# Patient Record
Sex: Female | Born: 1950 | Race: White | Hispanic: No | Marital: Married | Smoking: Never smoker
Health system: Southern US, Community
[De-identification: ages and names within clinical notes are randomized; demographics above are authoritative.]

## PROBLEM LIST (undated history)

## (undated) DIAGNOSIS — K5090 Crohn's disease, unspecified, without complications: Secondary | ICD-10-CM

## (undated) DIAGNOSIS — M1990 Unspecified osteoarthritis, unspecified site: Secondary | ICD-10-CM

## (undated) DIAGNOSIS — I10 Essential (primary) hypertension: Secondary | ICD-10-CM

## (undated) DIAGNOSIS — I499 Cardiac arrhythmia, unspecified: Secondary | ICD-10-CM

## (undated) DIAGNOSIS — N6019 Diffuse cystic mastopathy of unspecified breast: Secondary | ICD-10-CM

## (undated) DIAGNOSIS — E119 Type 2 diabetes mellitus without complications: Secondary | ICD-10-CM

## (undated) HISTORY — PX: CHOLECYSTECTOMY: SHX55

## (undated) HISTORY — PX: COLONOSCOPY: SHX174

## (undated) HISTORY — DX: Type 2 diabetes mellitus without complications: E11.9

## (undated) HISTORY — DX: Crohn's disease, unspecified, without complications: K50.90

## (undated) HISTORY — DX: Diffuse cystic mastopathy of unspecified breast: N60.19

---

## 2005-05-10 ENCOUNTER — Ambulatory Visit: Payer: Self-pay | Admitting: Internal Medicine

## 2005-05-13 ENCOUNTER — Ambulatory Visit: Payer: Self-pay | Admitting: Internal Medicine

## 2005-06-12 ENCOUNTER — Ambulatory Visit: Payer: Self-pay | Admitting: Internal Medicine

## 2005-07-13 ENCOUNTER — Ambulatory Visit: Payer: Self-pay | Admitting: Internal Medicine

## 2006-01-25 ENCOUNTER — Ambulatory Visit: Payer: Self-pay

## 2006-02-02 ENCOUNTER — Ambulatory Visit: Payer: Self-pay

## 2006-07-25 ENCOUNTER — Ambulatory Visit: Payer: Self-pay | Admitting: General Surgery

## 2007-01-16 ENCOUNTER — Ambulatory Visit: Payer: Self-pay | Admitting: General Surgery

## 2007-12-23 ENCOUNTER — Emergency Department: Payer: Self-pay | Admitting: Emergency Medicine

## 2008-02-06 ENCOUNTER — Ambulatory Visit: Payer: Self-pay

## 2009-03-19 ENCOUNTER — Ambulatory Visit: Payer: Self-pay | Admitting: General Surgery

## 2010-03-20 ENCOUNTER — Ambulatory Visit: Payer: Self-pay | Admitting: General Surgery

## 2010-06-22 ENCOUNTER — Emergency Department: Payer: Self-pay | Admitting: Emergency Medicine

## 2010-07-07 ENCOUNTER — Ambulatory Visit: Payer: Self-pay | Admitting: Specialist

## 2011-05-12 ENCOUNTER — Ambulatory Visit: Payer: Self-pay

## 2012-06-05 DIAGNOSIS — K5080 Crohn's disease of both small and large intestine without complications: Secondary | ICD-10-CM | POA: Insufficient documentation

## 2012-06-13 ENCOUNTER — Ambulatory Visit: Payer: Self-pay | Admitting: General Surgery

## 2013-05-16 ENCOUNTER — Telehealth: Payer: Self-pay | Admitting: General Surgery

## 2013-05-16 NOTE — Telephone Encounter (Signed)
Message copied by Christene Lye on Wed May 16, 2013  3:07 PM ------      Message from: Lesly Rubenstein      Created: Tue May 08, 2013  3:20 PM      Regarding: recent mammogram       Patient was on July recall list for yearly mammogram and follow up office visit. Patient did not see you last year because her mammogram was negative. When I spoke with the patient she states that she already had her mammogram done at [facility] and that everything was negative and did not feel a need to return here to see you. I let her know that I would send you a note and get back with her if you felt you needed to see her this year. ------

## 2013-05-16 NOTE — Telephone Encounter (Signed)
Talked with pt regarding her decision to follow with mammogram and exam by Dr. Laurey Morale. She has 2 sisters with breast cancer and does not think they had genetic testing done. Explained role of genetic testing and rational use of MRI in high risk situations like her's. Pt states she will think about this and let us know.  Explained to pt that if she elects not to do genetic test or periodic MRI she should continue with monthly self exams, and yearly MD exam and mammogram without fail.

## 2013-12-13 HISTORY — PX: COLONOSCOPY: SHX174

## 2015-02-20 DIAGNOSIS — E781 Pure hyperglyceridemia: Secondary | ICD-10-CM | POA: Insufficient documentation

## 2016-02-16 DIAGNOSIS — E781 Pure hyperglyceridemia: Secondary | ICD-10-CM | POA: Diagnosis not present

## 2016-02-16 DIAGNOSIS — Z23 Encounter for immunization: Secondary | ICD-10-CM | POA: Diagnosis not present

## 2016-02-16 DIAGNOSIS — K5080 Crohn's disease of both small and large intestine without complications: Secondary | ICD-10-CM | POA: Diagnosis not present

## 2016-02-16 DIAGNOSIS — E119 Type 2 diabetes mellitus without complications: Secondary | ICD-10-CM | POA: Diagnosis not present

## 2016-02-23 DIAGNOSIS — Z0000 Encounter for general adult medical examination without abnormal findings: Secondary | ICD-10-CM | POA: Diagnosis not present

## 2016-02-23 DIAGNOSIS — E119 Type 2 diabetes mellitus without complications: Secondary | ICD-10-CM | POA: Diagnosis not present

## 2016-02-23 DIAGNOSIS — M79605 Pain in left leg: Secondary | ICD-10-CM | POA: Diagnosis not present

## 2016-02-23 DIAGNOSIS — M79604 Pain in right leg: Secondary | ICD-10-CM | POA: Diagnosis not present

## 2016-02-23 DIAGNOSIS — E669 Obesity, unspecified: Secondary | ICD-10-CM | POA: Diagnosis not present

## 2016-02-23 DIAGNOSIS — Z780 Asymptomatic menopausal state: Secondary | ICD-10-CM | POA: Diagnosis not present

## 2016-02-23 DIAGNOSIS — E781 Pure hyperglyceridemia: Secondary | ICD-10-CM | POA: Diagnosis not present

## 2016-02-23 DIAGNOSIS — K5080 Crohn's disease of both small and large intestine without complications: Secondary | ICD-10-CM | POA: Diagnosis not present

## 2016-03-05 DIAGNOSIS — Z780 Asymptomatic menopausal state: Secondary | ICD-10-CM | POA: Diagnosis not present

## 2016-03-09 DIAGNOSIS — Z01419 Encounter for gynecological examination (general) (routine) without abnormal findings: Secondary | ICD-10-CM | POA: Diagnosis not present

## 2016-03-09 DIAGNOSIS — R928 Other abnormal and inconclusive findings on diagnostic imaging of breast: Secondary | ICD-10-CM | POA: Diagnosis not present

## 2016-04-02 DIAGNOSIS — R922 Inconclusive mammogram: Secondary | ICD-10-CM | POA: Diagnosis not present

## 2016-04-02 DIAGNOSIS — R928 Other abnormal and inconclusive findings on diagnostic imaging of breast: Secondary | ICD-10-CM | POA: Diagnosis not present

## 2016-04-02 DIAGNOSIS — Z9889 Other specified postprocedural states: Secondary | ICD-10-CM | POA: Diagnosis not present

## 2016-04-02 DIAGNOSIS — R921 Mammographic calcification found on diagnostic imaging of breast: Secondary | ICD-10-CM | POA: Diagnosis not present

## 2016-04-12 ENCOUNTER — Encounter: Payer: Self-pay | Admitting: General Surgery

## 2016-04-12 ENCOUNTER — Ambulatory Visit (INDEPENDENT_AMBULATORY_CARE_PROVIDER_SITE_OTHER): Payer: PPO | Admitting: General Surgery

## 2016-04-12 VITALS — BP 124/76 | HR 74 | Resp 16 | Ht 65.0 in | Wt 176.0 lb

## 2016-04-12 DIAGNOSIS — R920 Mammographic microcalcification found on diagnostic imaging of breast: Secondary | ICD-10-CM

## 2016-04-12 NOTE — Patient Instructions (Signed)
Stereotactic Breast Biopsy A stereotactic breast biopsy is a procedure in which mammography is used in the collection of a sample of breast tissue. Mammography is a type of X-ray exam of the breasts that produces an image called a mammogram. The mammogram allows your health care provider to precisely locate the area of the breast from which a tissue sample will be taken. The tissue is then examined under a microscope to see if cancerous cells are present. A breast biopsy is done when:   A lump, abnormality, or mass is seen in the breast on a breast X-ray (mammogram).   Small calcium deposits (calcifications) are seen in the breast.   The shape or appearance of the breasts changes.   The shape or appearance of the nipples changes. You may have unusual or bloody discharge coming from the nipples, or you may have crusting, retraction, or dimpling of the nipples. A breast biopsy can indicate if you need surgery or other treatment.  LET [facility] CARE PROVIDER KNOW ABOUT:  Any allergies you have.  All medicines you are taking, including vitamins, herbs, eye drops, creams, and over-the-counter medicines.  Previous problems you or members of your family have had with the use of anesthetics.  Any blood disorders you have.  Previous surgeries you have had.  Medical conditions you have. RISKS AND COMPLICATIONS Generally, stereotactic breast biopsy is a safe procedure. However, as with any procedure, complications can occur. Possible complications include:  Infection at the needle-insertion site.   Bleeding or bruising after surgery.  The breast may become altered or deformed as a result of the procedure.  The needle may go through the chest wall into the lung area.  BEFORE THE PROCEDURE  Wear a supportive bra to the procedure.  You will be asked to remove jewelry, dentures, eyeglasses, metal objects, or clothing that might interfere with the X-ray images. You may want to leave  some of these objects at home.  Arrange for someone to drive you home after the procedure if desired. PROCEDURE  A stereotactic breast biopsy is done while you are awake. During the procedure, relax as much as possible. Let your health care provider know if you are uncomfortable, anxious, or in pain. Usually, the only discomfort felt during the procedure is caused by staying in one position for the length of the procedure. This discomfort can be reduced by carefully placed cushions. Most of the time the biopsy is done using a table with openings on it. You will be asked to lie facedown on the table and place your breasts through the openings. Your breast is compressed between metal plates to get good X-ray images. Your skin will be cleaned, and a numbing medicine (local anesthetic) will be injected. A small cut (incision) will be made in your breast. The tip of the biopsy needle will be directed through the incision. Several small pieces of suspicious tissue will be taken. Then, a final set of X-ray images will be obtained. If they show that the suspicious tissue has been mostly or completely removed, a small clip will be left at the biopsy site. This is done so that the biopsy site can be easily located if the results of the biopsy show that the tissue is cancerous.  After the procedure, the incision will be stitched (sutured) or taped and covered with a bandage (dressing). Your health care provider may apply a pressure dressing and an ice pack to prevent bleeding and swelling in the breast.  A stereotactic  breast biopsy can take 30 minutes or more. AFTER THE PROCEDURE  If you are doing well and have no problems, you will be allowed to go home.    This information is not intended to replace advice given to you by your health care provider. Make sure you discuss any questions you have with your health care provider.   Document Released: 08/28/2003 Document Revised: 12/04/2013 Document Reviewed:  06/28/2013 Elsevier Interactive Patient Education [employer].

## 2016-04-12 NOTE — Progress Notes (Signed)
Patient ID: Dorothy Murray, female   DOB: 12-27-1950, 65 y.o.   MRN: [id number]  Chief Complaint  Patient presents with  . Other    mammogram    HPI Dorothy Murray is a 65 y.o. female who presents for a breast evaluation. The most recent mammogram was done on 04/02/16. Patient does perform regular self breast checks and gets regular mammograms done. She denies any breast pain or tenderness.  I have reviewed the history of present illness with the patient.  HPI  Past Medical History  Diagnosis Date  . Crohn's disease ([place])   . Diffuse cystic mastopathy   . 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 Breast biopsy Left 2007    stereo  . Cholecystectomy    . Colonoscopy  2010, 2015    [facility]    Family History  Problem Relation Age of Onset  . Breast cancer Sister   . Breast cancer Daughter 80  . Heart attack Mother   . Heart attack Father   . Stomach cancer Brother   . Stomach cancer Sister     Social History Social History  Substance Use Topics  . Smoking status: Never Smoker   . Smokeless tobacco: Never Used  . Alcohol Use: No    No Known Allergies  Current Outpatient Prescriptions  Medication Sig Dispense Refill  . aspirin EC 81 MG tablet Take by mouth.    . metFORMIN (GLUCOPHAGE) 1000 MG tablet Take 1,000 mg by mouth 2 (two) times daily.  5  . Omega-3 Fatty Acids (FISH OIL PO) Take 1 capsule by mouth daily.    Marland Kitchen VICTOZA 18 MG/3ML SOPN INJECT 0.3 MLS (1.8 MG TOTAL) SUBCUTANEOUSLY ONCE DAILY.  5   No current facility-administered medications for this visit.    Review of Systems Review of Systems  Constitutional: Negative.   Respiratory: Negative.   Cardiovascular: Negative.     Blood pressure 124/76, pulse 74, resp. rate 16, height 5' 5"  (1.651 m), weight 176 lb (79.833 kg).  Physical Exam Physical Exam  Constitutional: She is oriented to person, place, and time. She appears  well-developed and well-nourished.  Eyes: Conjunctivae are normal. No scleral icterus.  Neck: Neck supple.  Cardiovascular: Normal rate, regular rhythm and normal heart sounds.   Pulmonary/Chest: Effort normal and breath sounds normal. Right breast exhibits no inverted nipple, no mass, no nipple discharge, no skin change and no tenderness. Left breast exhibits no inverted nipple, no mass, no nipple discharge, no skin change and no tenderness.  Abdominal: Soft. Bowel sounds are normal. There is no tenderness.  Lymphadenopathy:    She has no cervical adenopathy.    She has no axillary adenopathy.  Neurological: She is alert and oriented to person, place, and time.  Skin: Skin is warm and dry.    Data Reviewed Mammogram reviewed. Microcalcifications in right breast have increased in number. Also a new focus of calcifications in left breast-not as suspicious as right side.   Assessment   FH of breast cancer. Mammographic abnormality as above. Stereotactic biopsy of right breast and possibly left also. Pt is agreeable.    Plan    Stereotactic biopsy of both breasts. Follow up based on path finding    PCP:  [facility] This information has been scribed by Gaspar Cola CMA.    Atzin Buchta G 04/13/2016, 11:06 AM

## 2016-04-13 ENCOUNTER — Telehealth: Payer: Self-pay

## 2016-04-13 ENCOUNTER — Other Ambulatory Visit: Payer: Self-pay | Admitting: General Surgery

## 2016-04-13 ENCOUNTER — Encounter: Payer: Self-pay | Admitting: General Surgery

## 2016-04-13 ENCOUNTER — Other Ambulatory Visit: Payer: Self-pay

## 2016-04-13 DIAGNOSIS — R920 Mammographic microcalcification found on diagnostic imaging of breast: Secondary | ICD-10-CM

## 2016-04-13 NOTE — Telephone Encounter (Signed)
Patient is scheduled for a Stereo Biopsy of both breasts at [facility] on 04/19/16 at 1:00 pm. She is to arrive there by 12:30 pm. Paperwork and instructions have been mailed to her. She is aware of date, time, and instructions.

## 2016-04-15 ENCOUNTER — Telehealth: Payer: Self-pay | Admitting: *Deleted

## 2016-04-15 NOTE — Telephone Encounter (Signed)
-----   Message from Robert Bellow, MD sent at 04/14/2016  3:51 PM EDT ----- Patient is presently scheduled for bilateral stereotactic biopsy on Monday. This is Dr. Angie Fava patient. Please call her and make sure she has no questions or concerns about the procedure. We will plan to do the right breast first and do the left breast second.Marland Kitchen

## 2016-04-15 NOTE — Telephone Encounter (Signed)
I talked with the patient, questions answered with understanding. The patient is aware to call back for any further questions or concerns.

## 2016-04-19 ENCOUNTER — Ambulatory Visit
Admission: RE | Admit: 2016-04-19 | Discharge: 2016-04-19 | Disposition: A | Discharge status: Home / Self Care | Payer: PPO | Source: Ambulatory Visit | Attending: General Surgery | Admitting: General Surgery

## 2016-04-19 DIAGNOSIS — R920 Mammographic microcalcification found on diagnostic imaging of breast: Secondary | ICD-10-CM

## 2016-04-19 DIAGNOSIS — R921 Mammographic calcification found on diagnostic imaging of breast: Secondary | ICD-10-CM | POA: Insufficient documentation

## 2016-04-19 DIAGNOSIS — N6091 Unspecified benign mammary dysplasia of right breast: Secondary | ICD-10-CM | POA: Diagnosis not present

## 2016-04-19 DIAGNOSIS — D242 Benign neoplasm of left breast: Secondary | ICD-10-CM | POA: Diagnosis not present

## 2016-04-19 HISTORY — PX: BREAST BIOPSY: SHX20

## 2016-04-19 HISTORY — PX: BREAST EXCISIONAL BIOPSY: SUR124

## 2016-04-21 LAB — SURGICAL PATHOLOGY

## 2016-04-21 NOTE — Progress Notes (Signed)
Appointment with Dr. Jamal Collin for Monday, 04-26-16 at 4:15 pm.

## 2016-04-26 ENCOUNTER — Encounter: Payer: Self-pay | Admitting: General Surgery

## 2016-04-26 ENCOUNTER — Ambulatory Visit (INDEPENDENT_AMBULATORY_CARE_PROVIDER_SITE_OTHER): Payer: PPO | Admitting: General Surgery

## 2016-04-26 VITALS — BP 130/70 | HR 72 | Resp 14 | Ht 65.0 in | Wt 176.0 lb

## 2016-04-26 DIAGNOSIS — N6091 Unspecified benign mammary dysplasia of right breast: Secondary | ICD-10-CM | POA: Diagnosis not present

## 2016-04-26 NOTE — Progress Notes (Signed)
Patient ID: SARANNE CRISLIP, female   DOB: 02/03/1951, 65 y.o.   MRN: [id number]  Chief Complaint  Patient presents with  . Routine Post Op    stereo    HPI KRYSTIN KEEVEN is a 65 y.o. female. here today fopr her follow up bilateral stereo done on 04/19/16. Patient states she is doing well. I have reviewed the history of present illness with the patient.  HPI  Past Medical History  Diagnosis Date  . Crohn's disease ([place])   . Diffuse cystic mastopathy   . 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 Cholecystectomy    . Colonoscopy  2010, 2015    [facility]  . Breast biopsy Left 2007    stereo  . Breast biopsy Bilateral 04/19/2016    stereo w/ Dr. Bary Castilla path pending    Family History  Problem Relation Age of Onset  . Breast cancer Sister   . Breast cancer Daughter 67  . Heart attack Mother   . Heart attack Father   . Stomach cancer Brother   . Stomach cancer Sister     Social History Social History  Substance Use Topics  . Smoking status: Never Smoker   . Smokeless tobacco: Never Used  . Alcohol Use: No    No Known Allergies  Current Outpatient Prescriptions  Medication Sig Dispense Refill  . aspirin EC 81 MG tablet Take by mouth.    . metFORMIN (GLUCOPHAGE) 1000 MG tablet Take 1,000 mg by mouth 2 (two) times daily.  5  . Omega-3 Fatty Acids (FISH OIL PO) Take 1 capsule by mouth daily.    Marland Kitchen VICTOZA 18 MG/3ML SOPN INJECT 0.3 MLS (1.8 MG TOTAL) SUBCUTANEOUSLY ONCE DAILY.  5   No current facility-administered medications for this visit.    Review of Systems Review of Systems  Blood pressure 130/70, pulse 72, resp. rate 14, height 5' 5"  (1.651 m), weight 176 lb (79.833 kg).  Physical Exam Physical Exam  Constitutional: She is oriented to person, place, and time. She appears well-developed and well-nourished.  Eyes: Conjunctivae are normal. No scleral icterus.  Neck: Neck supple.  Cardiovascular:  Normal rate, regular rhythm and normal heart sounds.   Pulmonary/Chest: Effort normal and breath sounds normal.  Lymphadenopathy:    She has no cervical adenopathy.  Neurological: She is alert and oriented to person, place, and time.  Skin: Skin is warm and dry.    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 Wide excision right breast lesion indicated. Discussed fully with pt and she is agreeable.    Plan    Patient to have a right breast lumpectomy.  Procedure explained to her including risks and benefits This patient's surgery has been scheduled for 05-03-16 at [facility].     PCP:  Tracie Harrier This information has been scribed by Gaspar Cola CMA.    Nevia Henkin G 04/26/2016, 5:00 PM

## 2016-04-26 NOTE — Addendum Note (Signed)
Addended by: Dominga Ferry on: 04/26/2016 05:13 PM   Modules accepted: Orders

## 2016-04-26 NOTE — Patient Instructions (Addendum)
Patient to have a right breast lumpectomy done at [facility].  This patient's surgery has been scheduled for 05-03-16 at [facility].

## 2016-04-27 ENCOUNTER — Telehealth: Payer: Self-pay

## 2016-04-27 ENCOUNTER — Other Ambulatory Visit: Payer: Self-pay | Admitting: General Surgery

## 2016-04-27 DIAGNOSIS — N6091 Unspecified benign mammary dysplasia of right breast: Secondary | ICD-10-CM

## 2016-04-27 NOTE — Telephone Encounter (Signed)
Spoke with patient about her surgery at [facility] scheduled for 05/03/16. She is to arrive at the [facility] on 05/03/16 at 8:00 am. Patient is aware of date, time, and instructions.

## 2016-04-29 ENCOUNTER — Encounter: Payer: Self-pay | Admitting: *Deleted

## 2016-04-29 ENCOUNTER — Other Ambulatory Visit: Payer: PPO

## 2016-04-29 NOTE — Patient Instructions (Addendum)
  Your procedure is scheduled on: 05-03-16 [facility]) Report to [place] @ 8AM   Remember: Instructions that are not followed completely may result in serious medical risk, up to and including death, or upon the discretion of your surgeon and anesthesiologist your surgery may need to be rescheduled.    _X___ 1. Do not eat food or drink liquids after midnight. No gum chewing or hard candies.     _X___ 2. No Alcohol for 24 hours before or after surgery.   ____ 3. Bring all medications with you on the day of surgery if instructed.    _X___ 4. Notify your doctor if there is any change in your medical condition     (cold, fever, infections).     Do not wear jewelry, make-up, hairpins, clips or nail polish.  Do not wear lotions, powders, or perfumes. You may wear deodorant.  Do not shave 48 hours prior to surgery. Men may shave face and neck.  Do not bring valuables to the hospital.    [facility] is not responsible for any belongings or valuables.               Contacts, dentures or bridgework may not be worn into surgery.  Leave your suitcase in the car. After surgery it may be brought to your room.  For patients admitted to the hospital, discharge time is determined by your treatment team.   Patients discharged the day of surgery will not be allowed to drive home.   Please read over the following fact sheets that you were given:     ____ Take these medicines the morning of surgery with A SIP OF WATER:    1. NONE  2.   3.   4.  5.  6.  ____ Fleet Enema (as directed)   _X___ Use CHG Soap as directed  ____ Use inhalers on the day of surgery  _X__ Stop metformin 2 days prior to surgery-LAST DOSE ON Friday 04-30-16  _X__ Take 1/2 of usual insulin dose the night before surgery and none on the morning of surgery-ONLY TAKE 10 UNITS OF LANTUS ON Sunday NIGHT AND NO INSULIN AM OF SURGERY  ____ Stop Coumadin/Plavix/aspirin-OK TO CONTINUE 81 MG ASPIRIN-DO NOT TAKE THE  MORNING OF SURGERY  _X__ Stop Anti-inflammatories-NO NSAIDS OR ASPIRIN PRODUCTS-TYLENOL OK TO TAKE   _X__ Stop supplements until after surgery-STOP FISH OIL NOW, GARLIC AND CINNAMON NOW  ____ Bring C-Pap to the hospital.

## 2016-04-30 ENCOUNTER — Inpatient Hospital Stay
Admission: RE | Admit: 2016-04-30 | Discharge: 2016-04-30 | Disposition: A | Discharge status: Home / Self Care | Payer: PPO | Source: Ambulatory Visit

## 2016-04-30 ENCOUNTER — Ambulatory Visit
Admission: RE | Admit: 2016-04-30 | Discharge: 2016-04-30 | Disposition: A | Discharge status: Home / Self Care | Payer: PPO | Source: Ambulatory Visit | Attending: General Surgery | Admitting: General Surgery

## 2016-04-30 DIAGNOSIS — R002 Palpitations: Secondary | ICD-10-CM | POA: Diagnosis not present

## 2016-04-30 DIAGNOSIS — Z01810 Encounter for preprocedural cardiovascular examination: Secondary | ICD-10-CM | POA: Insufficient documentation

## 2016-04-30 DIAGNOSIS — E119 Type 2 diabetes mellitus without complications: Secondary | ICD-10-CM | POA: Insufficient documentation

## 2016-05-03 ENCOUNTER — Ambulatory Visit
Admission: RE | Admit: 2016-05-03 | Discharge: 2016-05-03 | Disposition: A | Discharge status: Home / Self Care | Payer: PPO | Source: Ambulatory Visit | Attending: General Surgery | Admitting: General Surgery

## 2016-05-03 ENCOUNTER — Encounter: Payer: Self-pay | Admitting: *Deleted

## 2016-05-03 ENCOUNTER — Encounter
Admission: RE | Disposition: A | Discharge status: Home / Self Care | Payer: Self-pay | Source: Ambulatory Visit | Attending: General Surgery

## 2016-05-03 ENCOUNTER — Ambulatory Visit: Payer: PPO | Admitting: Anesthesiology

## 2016-05-03 DIAGNOSIS — N6081 Other benign mammary dysplasias of right breast: Secondary | ICD-10-CM | POA: Diagnosis not present

## 2016-05-03 DIAGNOSIS — Z803 Family history of malignant neoplasm of breast: Secondary | ICD-10-CM | POA: Diagnosis not present

## 2016-05-03 DIAGNOSIS — Z7982 Long term (current) use of aspirin: Secondary | ICD-10-CM | POA: Insufficient documentation

## 2016-05-03 DIAGNOSIS — Z800 Family history of malignant neoplasm of digestive organs: Secondary | ICD-10-CM | POA: Diagnosis not present

## 2016-05-03 DIAGNOSIS — N6091 Unspecified benign mammary dysplasia of right breast: Secondary | ICD-10-CM

## 2016-05-03 DIAGNOSIS — Z8249 Family history of ischemic heart disease and other diseases of the circulatory system: Secondary | ICD-10-CM | POA: Diagnosis not present

## 2016-05-03 DIAGNOSIS — K660 Peritoneal adhesions (postprocedural) (postinfection): Secondary | ICD-10-CM | POA: Diagnosis not present

## 2016-05-03 DIAGNOSIS — E119 Type 2 diabetes mellitus without complications: Secondary | ICD-10-CM | POA: Diagnosis not present

## 2016-05-03 DIAGNOSIS — Z79899 Other long term (current) drug therapy: Secondary | ICD-10-CM | POA: Insufficient documentation

## 2016-05-03 DIAGNOSIS — R002 Palpitations: Secondary | ICD-10-CM | POA: Diagnosis not present

## 2016-05-03 DIAGNOSIS — Z7984 Long term (current) use of oral hypoglycemic drugs: Secondary | ICD-10-CM | POA: Diagnosis not present

## 2016-05-03 DIAGNOSIS — N6489 Other specified disorders of breast: Secondary | ICD-10-CM | POA: Diagnosis not present

## 2016-05-03 DIAGNOSIS — K5090 Crohn's disease, unspecified, without complications: Secondary | ICD-10-CM | POA: Diagnosis not present

## 2016-05-03 DIAGNOSIS — Z9049 Acquired absence of other specified parts of digestive tract: Secondary | ICD-10-CM | POA: Diagnosis not present

## 2016-05-03 HISTORY — PX: BREAST LUMPECTOMY: SHX2

## 2016-05-03 HISTORY — DX: Cardiac arrhythmia, unspecified: I49.9

## 2016-05-03 HISTORY — PX: BREAST LUMPECTOMY WITH NEEDLE LOCALIZATION: SHX5759

## 2016-05-03 LAB — GLUCOSE, CAPILLARY
GLUCOSE-CAPILLARY: 231 mg/dL — AB (ref 65–99)
GLUCOSE-CAPILLARY: 163 mg/dL — AB (ref 65–99)

## 2016-05-03 SURGERY — BREAST LUMPECTOMY
Anesthesia: General | Laterality: Right | Wound class: Clean

## 2016-05-03 MED ORDER — METHYLENE BLUE 0.5 % INJ SOLN
INTRAVENOUS | Status: AC
  Filled 2016-05-03: qty 10

## 2016-05-03 MED ORDER — SODIUM CHLORIDE 0.9 % IJ SOLN
INTRAMUSCULAR | Status: AC
  Filled 2016-05-03: qty 10

## 2016-05-03 MED ORDER — FENTANYL CITRATE (PF) 100 MCG/2ML IJ SOLN
INTRAMUSCULAR | Status: DC | PRN
  Administered 2016-05-03 (×2): 50 ug via INTRAVENOUS

## 2016-05-03 MED ORDER — FAMOTIDINE 20 MG PO TABS
ORAL_TABLET | ORAL | Status: AC
  Filled 2016-05-03: qty 1

## 2016-05-03 MED ORDER — CHLORHEXIDINE GLUCONATE 4 % EX LIQD: 1.0000 "application " | Freq: Once | CUTANEOUS | Status: DC

## 2016-05-03 MED ORDER — TRAMADOL HCL 50 MG PO TABS
50.0000 mg | ORAL_TABLET | Freq: Four times a day (QID) | ORAL | Status: DC
  Administered 2016-05-03: 50 mg via ORAL
  Filled 2016-05-03: qty 1

## 2016-05-03 MED ORDER — ACETAMINOPHEN 10 MG/ML IV SOLN
INTRAVENOUS | Status: DC | PRN
  Administered 2016-05-03: 1000 mg via INTRAVENOUS

## 2016-05-03 MED ORDER — TRAMADOL HCL 50 MG PO TABS: 50.0000 mg | ORAL_TABLET | Freq: Four times a day (QID) | ORAL | Status: DC | PRN

## 2016-05-03 MED ORDER — FENTANYL CITRATE (PF) 100 MCG/2ML IJ SOLN: 25.0000 ug | INTRAMUSCULAR | Status: DC | PRN

## 2016-05-03 MED ORDER — ACETAMINOPHEN 10 MG/ML IV SOLN
INTRAVENOUS | Status: AC
  Filled 2016-05-03: qty 100

## 2016-05-03 MED ORDER — TRAMADOL HCL 50 MG PO TABS
ORAL_TABLET | ORAL | Status: AC
  Administered 2016-05-03: 50 mg via ORAL
  Filled 2016-05-03: qty 1

## 2016-05-03 MED ORDER — BUPIVACAINE HCL (PF) 0.5 % IJ SOLN
INTRAMUSCULAR | Status: AC
  Filled 2016-05-03: qty 30

## 2016-05-03 MED ORDER — ONDANSETRON HCL 4 MG/2ML IJ SOLN: 4.0000 mg | Freq: Once | INTRAMUSCULAR | Status: DC | PRN

## 2016-05-03 MED ORDER — PHENYLEPHRINE HCL 10 MG/ML IJ SOLN
INTRAMUSCULAR | Status: DC | PRN
  Administered 2016-05-03 (×3): 100 ug via INTRAVENOUS

## 2016-05-03 MED ORDER — PROPOFOL 10 MG/ML IV BOLUS
INTRAVENOUS | Status: DC | PRN
  Administered 2016-05-03: 150 mg via INTRAVENOUS

## 2016-05-03 MED ORDER — LIDOCAINE HCL (CARDIAC) 20 MG/ML IV SOLN
INTRAVENOUS | Status: DC | PRN
  Administered 2016-05-03: 60 mg via INTRAVENOUS

## 2016-05-03 MED ORDER — MIDAZOLAM HCL 2 MG/2ML IJ SOLN
INTRAMUSCULAR | Status: DC | PRN
  Administered 2016-05-03: 2 mg via INTRAVENOUS

## 2016-05-03 MED ORDER — SODIUM CHLORIDE 0.9 % IV SOLN
INTRAVENOUS | Status: DC
  Administered 2016-05-03 (×2): via INTRAVENOUS

## 2016-05-03 MED ORDER — CEFAZOLIN SODIUM 1-5 GM-% IV SOLN
INTRAVENOUS | Status: DC | PRN
  Administered 2016-05-03: 1 g via INTRAVENOUS

## 2016-05-03 MED ORDER — FAMOTIDINE 20 MG PO TABS
20.0000 mg | ORAL_TABLET | Freq: Once | ORAL | Status: AC
  Administered 2016-05-03: 20 mg via ORAL

## 2016-05-03 SURGICAL SUPPLY — 43 items
BLADE SURG 15 STRL SS SAFETY (BLADE) ×4 IMPLANT
BULB RESERV EVAC DRAIN JP 100C (MISCELLANEOUS) IMPLANT
CANISTER SUCT 1200ML W/VALVE (MISCELLANEOUS) ×2 IMPLANT
CHLORAPREP W/TINT 26ML (MISCELLANEOUS) ×2 IMPLANT
CNTNR SPEC 2.5X3XGRAD LEK (MISCELLANEOUS) ×2
CONT SPEC 4OZ STER OR WHT (MISCELLANEOUS) ×2
CONTAINER SPEC 2.5X3XGRAD LEK (MISCELLANEOUS) ×2 IMPLANT
COVER PROBE FLX POLY STRL (MISCELLANEOUS) ×2 IMPLANT
DEVICE DUBIN SPECIMEN MAMMOGRA (MISCELLANEOUS) ×2 IMPLANT
DEVICE LOCALIZATION ULTRAWIRE (WIRE) IMPLANT
DEVICE ULTRAWIRE LOCAL 19X9 (WIRE) IMPLANT
DRAIN CHANNEL JP 15F RND 16 (MISCELLANEOUS) IMPLANT
DRAPE LAPAROTOMY 100X77 ABD (DRAPES) ×2 IMPLANT
DRAPE LAPAROTOMY TRNSV 106X77 (MISCELLANEOUS) ×2 IMPLANT
ELECT REM PT RETURN 9FT ADLT (ELECTROSURGICAL) ×2
ELECTRODE REM PT RTRN 9FT ADLT (ELECTROSURGICAL) ×1 IMPLANT
GLOVE BIO SURGEON STRL SZ7 (GLOVE) ×2 IMPLANT
GOWN STRL REUS W/ TWL LRG LVL3 (GOWN DISPOSABLE) ×3 IMPLANT
GOWN STRL REUS W/TWL LRG LVL3 (GOWN DISPOSABLE) ×3
HARMONIC SCALPEL FOCUS (MISCELLANEOUS) IMPLANT
KIT RM TURNOVER STRD PROC AR (KITS) ×2 IMPLANT
LABEL OR SOLS (LABEL) ×2 IMPLANT
LIQUID BAND (GAUZE/BANDAGES/DRESSINGS) ×2 IMPLANT
MARGIN MAP 10MM (MISCELLANEOUS) ×2 IMPLANT
NDL HPO THNWL 1X22GA REG BVL (NEEDLE) ×1 IMPLANT
NEEDLE HYPO 25X1 1.5 SAFETY (NEEDLE) ×2 IMPLANT
NEEDLE SAFETY 22GX1 (NEEDLE) ×1
PACK BASIN MINOR ARMC (MISCELLANEOUS) ×2 IMPLANT
SUT ETH BLK MONO 3 0 FS 1 12/B (SUTURE) ×2 IMPLANT
SUT ETHILON 3-0 FS-10 30 BLK (SUTURE) ×2
SUT MNCRL AB 3-0 PS2 27 (SUTURE) ×2 IMPLANT
SUT VIC AB 2-0 BRD 54 (SUTURE) ×2 IMPLANT
SUT VIC AB 2-0 CT2 27 (SUTURE) ×4 IMPLANT
SUT VIC AB 2-0 SH 27 (SUTURE) ×1
SUT VIC AB 2-0 SH 27XBRD (SUTURE) ×1 IMPLANT
SUT VIC AB 3-0 54X BRD REEL (SUTURE) ×1 IMPLANT
SUT VIC AB 3-0 BRD 54 (SUTURE) ×1
SUT VIC AB 4-0 FS2 27 (SUTURE) ×2 IMPLANT
SUTURE EHLN 3-0 FS-10 30 BLK (SUTURE) ×1 IMPLANT
SYR CONTROL 10ML (SYRINGE) ×2 IMPLANT
ULTRAWIRE LOCAL DEVICE 19X9 (WIRE)
ULTRAWIRE LOCALIZATION DEVICE (WIRE)
WATER STERILE IRR 1000ML POUR (IV SOLUTION) ×2 IMPLANT

## 2016-05-03 NOTE — Anesthesia Preprocedure Evaluation (Signed)
Anesthesia Evaluation  Patient identified by MRN, date of birth, ID band Patient awake    Reviewed: Allergy & Precautions, NPO status , Patient's Chart, lab work & pertinent test results  Airway Mallampati: II       Dental  (+) Teeth Intact   Pulmonary neg pulmonary ROS,    Pulmonary exam normal        Cardiovascular negative cardio ROS   Rhythm:Regular Rate:Normal     Neuro/Psych    GI/Hepatic negative GI ROS, Neg liver ROS,   Endo/Other  diabetes, Well Controlled, Type 1, Insulin Dependent  Renal/GU negative Renal ROS     Musculoskeletal negative musculoskeletal ROS (+)   Abdominal Normal abdominal exam  (+)   Peds  Hematology negative hematology ROS (+)   Anesthesia Other Findings   Reproductive/Obstetrics                             Anesthesia Physical Anesthesia Plan  ASA: II  Anesthesia Plan: General   Post-op Pain Management:    Induction: Intravenous  Airway Management Planned: LMA  Additional Equipment:   Intra-op Plan:   Post-operative Plan: Extubation in OR  Informed Consent: I have reviewed the patients History and Physical, chart, labs and discussed the procedure including the risks, benefits and alternatives for the proposed anesthesia with the patient or authorized representative who has indicated his/her understanding and acceptance.     Plan Discussed with: CRNA  Anesthesia Plan Comments:         Anesthesia Quick Evaluation

## 2016-05-03 NOTE — Anesthesia Postprocedure Evaluation (Signed)
Anesthesia Post Note  Patient: Dorothy Murray  Procedure(s) Performed: Procedure(s) (LRB): BREAST LUMPECTOMY (Right) BREAST LUMPECTOMY WITH NEEDLE LOCALIZATION (Right)  Patient location during evaluation: PACU Anesthesia Type: General Level of consciousness: awake Pain management: pain level controlled Vital Signs Assessment: post-procedure vital signs reviewed and stable Respiratory status: nonlabored ventilation Cardiovascular status: stable Anesthetic complications: no    Last Vitals:  Filed Vitals:   05/03/16 1208 05/03/16 1226  BP: 142/66 146/72  Pulse: 64 67  Temp:    Resp: 14 15    Last Pain:  Filed Vitals:   05/03/16 1229  PainSc: 1                  VAN STAVEREN,Doris Mcgilvery

## 2016-05-03 NOTE — Interval H&P Note (Signed)
History and Physical Interval Note:  05/03/2016 9:29 AM  Dorothy Murray  has presented today for surgery, with the diagnosis of ADH RIGHT BREAST  The various methods of treatment have been discussed with the patient and family. After consideration of risks, benefits and other options for treatment, the patient has consented to  Procedure(s): BREAST LUMPECTOMY (Right) BREAST LUMPECTOMY WITH NEEDLE LOCALIZATION (Right) as a surgical intervention .  The patient's history has been reviewed, patient examined, no change in status, stable for surgery.  I have reviewed the patient's chart and labs.  Questions were answered to the patient's satisfaction.     Mavryk Pino G

## 2016-05-03 NOTE — Discharge Instructions (Signed)
AMBULATORY SURGERY  °DISCHARGE INSTRUCTIONS ° ° °1) The drugs that you were given will stay in your system until tomorrow so for the next 24 hours you should not: ° °A) Drive an automobile °B) Make any legal decisions °C) Drink any alcoholic beverage ° ° °2) You may resume regular meals tomorrow.  Today it is better to start with liquids and gradually work up to solid foods. ° °You may eat anything you prefer, but it is better to start with liquids, then soup and crackers, and gradually work up to solid foods. ° ° °3) Please notify your doctor immediately if you have any unusual bleeding, trouble breathing, redness and pain at the surgery site, drainage, fever, or pain not relieved by medication. ° ° ° °4) Additional Instructions: ° ° ° ° ° ° ° °Please contact your physician with any problems or Same Day Surgery at [phone], Monday through Friday 6 am to 4 pm, or [place] [facility] Main number at [phone]. °

## 2016-05-03 NOTE — H&P (View-Only) (Signed)
Patient ID: Dorothy Murray, female   DOB: 01-30-1951, 65 y.o.   MRN: [id number]  Chief Complaint  Patient presents with  . Routine Post Op    stereo    HPI Dorothy Murray is a 65 y.o. female. here today fopr her follow up bilateral stereo done on 04/19/16. Patient states she is doing well. I have reviewed the history of present illness with the patient.  HPI  Past Medical History  Diagnosis Date  . Crohn's disease ([place])   . Diffuse cystic mastopathy   . 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 Cholecystectomy    . Colonoscopy  2010, 2015    [facility]  . Breast biopsy Left 2007    stereo  . Breast biopsy Bilateral 04/19/2016    stereo w/ Dr. Bary Castilla path pending    Family History  Problem Relation Age of Onset  . Breast cancer Sister   . Breast cancer Daughter 43  . Heart attack Mother   . Heart attack Father   . Stomach cancer Brother   . Stomach cancer Sister     Social History Social History  Substance Use Topics  . Smoking status: Never Smoker   . Smokeless tobacco: Never Used  . Alcohol Use: No    No Known Allergies  Current Outpatient Prescriptions  Medication Sig Dispense Refill  . aspirin EC 81 MG tablet Take by mouth.    . metFORMIN (GLUCOPHAGE) 1000 MG tablet Take 1,000 mg by mouth 2 (two) times daily.  5  . Omega-3 Fatty Acids (FISH OIL PO) Take 1 capsule by mouth daily.    Marland Kitchen VICTOZA 18 MG/3ML SOPN INJECT 0.3 MLS (1.8 MG TOTAL) SUBCUTANEOUSLY ONCE DAILY.  5   No current facility-administered medications for this visit.    Review of Systems Review of Systems  Blood pressure 130/70, pulse 72, resp. rate 14, height 5' 5"  (1.651 m), weight 176 lb (79.833 kg).  Physical Exam Physical Exam  Constitutional: She is oriented to person, place, and time. She appears well-developed and well-nourished.  Eyes: Conjunctivae are normal. No scleral icterus.  Neck: Neck supple.  Cardiovascular:  Normal rate, regular rhythm and normal heart sounds.   Pulmonary/Chest: Effort normal and breath sounds normal.  Lymphadenopathy:    She has no cervical adenopathy.  Neurological: She is alert and oriented to person, place, and time.  Skin: Skin is warm and dry.    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 Wide excision right breast lesion indicated. Discussed fully with pt and she is agreeable.    Plan    Patient to have a right breast lumpectomy.  Procedure explained to her including risks and benefits This patient's surgery has been scheduled for 05-03-16 at [facility].     PCP:  Tracie Harrier This information has been scribed by Gaspar Cola CMA.    Tyonna Talerico G 04/26/2016, 5:00 PM

## 2016-05-03 NOTE — Op Note (Signed)
Preop diagnosis: ADH right breast  Post op diagnosis: Same  Operation: Right breast wide excision  Surgeon: Harvin Hazel  Assistant:  Anesthesia: Gen.  Complications: None  EBL: Minimal  Drains: None  Description: This patient on recent stereo biopsy of an area of microcalcifications in right breast was noted to have ADH. Patient underwent a wire localization of the clip placed at this site in the upper outer quadrant of the right breast preoperatively. Patient was brought to the operating room and put to sleep with an LMA. Right breast was prepped and draped as sterile field. Timeout was performed. The area of concern in the upper-outer quadrant was relatively in the anterior depth of the breast and a curvilinear incision just inferior to the wire entrance was planned. Incision was made and deepened through the subcutaneous tissue and then the skin and subcutaneous tissue were elevated on both sides. The wire was freed from the overlying skin and with that as a guide to complete the excision of the area of concern was performed. Bleeding was minimal and controlled easily with cautery. The excised tissue was tagged for margins and specimen mammogram was obtained showing presence of the previously placed clip in the center of it. The specimen subsequently was sent to pathology. Wound was irrigated and closed the deeper tissues being closed with interrupted 2-0 Vicryl. Skin was closed with subcuticular 3-0 Monocryl and liqui  ban applied. Patient subsequently was extubated and returned recovery room stable condition

## 2016-05-03 NOTE — Interval H&P Note (Signed)
History and Physical Interval Note:  05/03/2016 9:30 AM  Dorothy Murray  has presented today for surgery, with the diagnosis of ADH RIGHT BREAST  The various methods of treatment have been discussed with the patient and family. After consideration of risks, benefits and other options for treatment, the patient has consented to  Procedure(s): BREAST LUMPECTOMY (Right) BREAST LUMPECTOMY WITH NEEDLE LOCALIZATION (Right) as a surgical intervention .  The patient's history has been reviewed, patient examined, no change in status, stable for surgery.  I have reviewed the patient's chart and labs.  Questions were answered to the patient's satisfaction.     SANKAR,SEEPLAPUTHUR G

## 2016-05-03 NOTE — Anesthesia Procedure Notes (Signed)
Procedure Name: LMA Insertion Date/Time: 05/03/2016 9:59 AM Performed by: [facility] Pre-anesthesia Checklist: Patient identified, Emergency Drugs available, Suction available and Patient being monitored Patient Re-evaluated:Patient Re-evaluated prior to inductionOxygen Delivery Method: Circle system utilized Preoxygenation: Pre-oxygenation with 100% oxygen Intubation Type: IV induction Ventilation: Mask ventilation without difficulty LMA: LMA inserted LMA Size: 3.5 Number of attempts: 1 Placement Confirmation: positive ETCO2 and breath sounds checked- equal and bilateral

## 2016-05-03 NOTE — Transfer of Care (Signed)
Immediate Anesthesia Transfer of Care Note  Patient: Dorothy Murray  Procedure(s) Performed: Procedure(s): BREAST LUMPECTOMY (Right) BREAST LUMPECTOMY WITH NEEDLE LOCALIZATION (Right)  Patient Location: PACU  Anesthesia Type:General  Level of Consciousness: sedated  Airway & Oxygen Therapy: Patient Spontanous Breathing and Patient connected to nasal cannula oxygen  Post-op Assessment: Report given to RN and Post -op Vital signs reviewed and stable  Post vital signs: Reviewed and stable  Last Vitals:  Filed Vitals:   05/03/16 0849 05/03/16 1053  BP: 146/72 145/78  Pulse: 81 75  Temp: 36.9 C 36.2 C  Resp: 16 13    Last Pain:  Filed Vitals:   05/03/16 1054  PainSc: Asleep         Complications: No apparent anesthesia complications

## 2016-05-03 NOTE — H&P (View-Only) (Signed)
Patient ID: Dorothy Murray, female   DOB: Feb 05, 1951, 65 y.o.   MRN: [id number]  Chief Complaint  Patient presents with  . Other    mammogram    HPI Dorothy Murray is a 65 y.o. female who presents for a breast evaluation. The most recent mammogram was done on 04/02/16. Patient does perform regular self breast checks and gets regular mammograms done. She denies any breast pain or tenderness.  I have reviewed the history of present illness with the patient.  HPI  Past Medical History  Diagnosis Date  . Crohn's disease ([place])   . Diffuse cystic mastopathy   . 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 Breast biopsy Left 2007    stereo  . Cholecystectomy    . Colonoscopy  2010, 2015    [facility]    Family History  Problem Relation Age of Onset  . Breast cancer Sister   . Breast cancer Daughter 53  . Heart attack Mother   . Heart attack Father   . Stomach cancer Brother   . Stomach cancer Sister     Social History Social History  Substance Use Topics  . Smoking status: Never Smoker   . Smokeless tobacco: Never Used  . Alcohol Use: No    No Known Allergies  Current Outpatient Prescriptions  Medication Sig Dispense Refill  . aspirin EC 81 MG tablet Take by mouth.    . metFORMIN (GLUCOPHAGE) 1000 MG tablet Take 1,000 mg by mouth 2 (two) times daily.  5  . Omega-3 Fatty Acids (FISH OIL PO) Take 1 capsule by mouth daily.    Marland Kitchen VICTOZA 18 MG/3ML SOPN INJECT 0.3 MLS (1.8 MG TOTAL) SUBCUTANEOUSLY ONCE DAILY.  5   No current facility-administered medications for this visit.    Review of Systems Review of Systems  Constitutional: Negative.   Respiratory: Negative.   Cardiovascular: Negative.     Blood pressure 124/76, pulse 74, resp. rate 16, height 5' 5"  (1.651 m), weight 176 lb (79.833 kg).  Physical Exam Physical Exam  Constitutional: She is oriented to person, place, and time. She appears  well-developed and well-nourished.  Eyes: Conjunctivae are normal. No scleral icterus.  Neck: Neck supple.  Cardiovascular: Normal rate, regular rhythm and normal heart sounds.   Pulmonary/Chest: Effort normal and breath sounds normal. Right breast exhibits no inverted nipple, no mass, no nipple discharge, no skin change and no tenderness. Left breast exhibits no inverted nipple, no mass, no nipple discharge, no skin change and no tenderness.  Abdominal: Soft. Bowel sounds are normal. There is no tenderness.  Lymphadenopathy:    She has no cervical adenopathy.    She has no axillary adenopathy.  Neurological: She is alert and oriented to person, place, and time.  Skin: Skin is warm and dry.    Data Reviewed Mammogram reviewed. Microcalcifications in right breast have increased in number. Also a new focus of calcifications in left breast-not as suspicious as right side.   Assessment   FH of breast cancer. Mammographic abnormality as above. Stereotactic biopsy of right breast and possibly left also. Pt is agreeable.    Plan    Stereotactic biopsy of both breasts. Follow up based on path finding    PCP:  [facility] This information has been scribed by Gaspar Cola CMA.    Adriell Polansky G 04/13/2016, 11:06 AM

## 2016-05-05 LAB — SURGICAL PATHOLOGY

## 2016-05-06 ENCOUNTER — Ambulatory Visit (INDEPENDENT_AMBULATORY_CARE_PROVIDER_SITE_OTHER): Payer: PPO | Admitting: General Surgery

## 2016-05-06 ENCOUNTER — Encounter: Payer: Self-pay | Admitting: General Surgery

## 2016-05-06 VITALS — BP 134/80 | HR 82 | Resp 14 | Ht 65.5 in | Wt 175.0 lb

## 2016-05-06 DIAGNOSIS — N6091 Unspecified benign mammary dysplasia of right breast: Secondary | ICD-10-CM

## 2016-05-06 DIAGNOSIS — N62 Hypertrophy of breast: Secondary | ICD-10-CM

## 2016-05-06 NOTE — Patient Instructions (Addendum)
The patient is aware to call back for any questions or concerns. Tamoxifen oral tablet What is this medicine? TAMOXIFEN (ta MOX i fen) blocks the effects of estrogen. It is commonly used to treat breast cancer. It is also used to decrease the chance of breast cancer coming back in women who have received treatment for the disease. It may also help prevent breast cancer in women who have a high risk of developing breast cancer. This medicine may be used for other purposes; ask your health care provider or pharmacist if you have questions. What should I tell my health care provider before I take this medicine? They need to know if you have any of these conditions: -blood clots -blood disease -cataracts or impaired eyesight -endometriosis -high calcium levels -high cholesterol -irregular menstrual cycles -liver disease -stroke -uterine fibroids -an unusual or allergic reaction to tamoxifen, other medicines, foods, dyes, or preservatives -pregnant or trying to get pregnant -breast-feeding How should I use this medicine? Take this medicine by mouth with a glass of water. Follow the directions on the prescription label. You can take it with or without food. Take your medicine at regular intervals. Do not take your medicine more often than directed. Do not stop taking except on your doctor's advice. A special MedGuide will be given to you by the pharmacist with each prescription and refill. Be sure to read this information carefully each time. Talk to your pediatrician regarding the use of this medicine in children. While this drug may be prescribed for selected conditions, precautions do apply. Overdosage: If you think you have taken too much of this medicine contact a poison control center or emergency room at once. NOTE: This medicine is only for you. Do not share this medicine with others. What if I miss a dose? If you miss a dose, take it as soon as you can. If it is almost time for your  next dose, take only that dose. Do not take double or extra doses. What may interact with this medicine? -aminoglutethimide -bromocriptine -chemotherapy drugs -female hormones, like estrogens and birth control pills -letrozole -medroxyprogesterone -phenobarbital -rifampin -warfarin This list may not describe all possible interactions. Give your health care provider a list of all the medicines, herbs, non-prescription drugs, or dietary supplements you use. Also tell them if you smoke, drink alcohol, or use illegal drugs. Some items may interact with your medicine. What should I watch for while using this medicine? Visit your doctor or health care professional for regular checks on your progress. You will need regular pelvic exams, breast exams, and mammograms. If you are taking this medicine to reduce your risk of getting breast cancer, you should know that this medicine does not prevent all types of breast cancer. If breast cancer or other problems occur, there is no guarantee that it will be found at an early stage. Do not become pregnant while taking this medicine or for 2 months after stopping this medicine. Stop taking this medicine if you get pregnant or think you are pregnant and contact your doctor. This medicine may harm your unborn baby. Women who can possibly become pregnant should use birth control methods that do not use hormones during tamoxifen treatment and for 2 months after therapy has stopped. Talk with your health care provider for birth control advice. Do not breast feed while taking this medicine. What side effects may I notice from receiving this medicine? Side effects that you should report to your doctor or health care professional as soon  as possible: -changes in vision (blurred vision) -changes in your menstrual cycle -difficulty breathing or shortness of breath -difficulty walking or talking -new breast lumps -numbness -pelvic pain or pressure -redness, blistering,  peeling or loosening of the skin, including inside the mouth -skin rash or itching (hives) -sudden chest pain -swelling of lips, face, or tongue -swelling, pain or tenderness in your calf or leg -unusual bruising or bleeding -vaginal discharge that is bloody, brown, or rust -weakness -yellowing of the whites of the eyes or skin Side effects that usually do not require medical attention (report to your doctor or health care professional if they continue or are bothersome): -fatigue -hair loss, although uncommon and is usually mild -headache -hot flashes -impotence (in men) -nausea, vomiting (mild) -vaginal discharge (white or clear) This list may not describe all possible side effects. Call your doctor for medical advice about side effects. You may report side effects to FDA at 1-800-FDA-1088. Where should I keep my medicine? Keep out of the reach of children. Store at room temperature between 20 and 25 degrees C (68 and 77 degrees F). Protect from light. Keep container tightly closed. Throw away any unused medicine after the expiration date. NOTE: This sheet is a summary. It may not cover all possible information. If you have questions about this medicine, talk to your doctor, pharmacist, or health care provider.    2016, Elsevier/Gold Standard. (2008-08-15 12:01:56)

## 2016-05-06 NOTE — Progress Notes (Signed)
Here today for postoperative visit, right breast lumpectomy on 05-03-16 for ADH, she states she is doing well. Minimal to no pain. I have reviewed the history of present illness with the patient.  Incision healing well, no redness or drainage. Path- ADH, margins clear. Pt advised  Discussed antihormonal therapy in detail, information given.  Follow up in 2 weeks.  PCP:  Tracie Harrier This information has been scribed by Karie Fetch RN, BSN,BC.

## 2016-05-09 ENCOUNTER — Encounter: Payer: Self-pay | Admitting: General Surgery

## 2016-05-12 ENCOUNTER — Encounter: Payer: Self-pay | Admitting: General Surgery

## 2016-05-24 ENCOUNTER — Encounter: Payer: Self-pay | Admitting: *Deleted

## 2016-05-31 ENCOUNTER — Encounter: Payer: Self-pay | Admitting: General Surgery

## 2016-05-31 ENCOUNTER — Ambulatory Visit (INDEPENDENT_AMBULATORY_CARE_PROVIDER_SITE_OTHER): Payer: PPO | Admitting: General Surgery

## 2016-05-31 VITALS — BP 130/76 | HR 66 | Resp 12 | Ht 65.0 in | Wt 174.0 lb

## 2016-05-31 DIAGNOSIS — Z803 Family history of malignant neoplasm of breast: Secondary | ICD-10-CM

## 2016-05-31 DIAGNOSIS — N62 Hypertrophy of breast: Secondary | ICD-10-CM

## 2016-05-31 DIAGNOSIS — N6091 Unspecified benign mammary dysplasia of right breast: Secondary | ICD-10-CM

## 2016-05-31 NOTE — Progress Notes (Signed)
Here today for postoperative visit, right breast lumpectomy on 05-03-16 for ADH. she states she is doing well. Minimal to no pain.  I have reviewed the history of present illness with the patient.    Incision on RUQ of right breast is healing well, no erythema or drainage. Patient considered but declined Anti-hormonal therapy for prevention.  Path- ADH, margins clear. Pt advised Return in November with Right Diagnostic Mammogram and office visit. Pt to continue self-breast exams. Pt is agreeable to plan.   This has been scribed by Lesly Rubenstein LPN  PCP:  Tracie Harrier

## 2016-06-14 DIAGNOSIS — M79605 Pain in left leg: Secondary | ICD-10-CM | POA: Diagnosis not present

## 2016-06-14 DIAGNOSIS — E781 Pure hyperglyceridemia: Secondary | ICD-10-CM | POA: Diagnosis not present

## 2016-06-14 DIAGNOSIS — E669 Obesity, unspecified: Secondary | ICD-10-CM | POA: Diagnosis not present

## 2016-06-14 DIAGNOSIS — E119 Type 2 diabetes mellitus without complications: Secondary | ICD-10-CM | POA: Diagnosis not present

## 2016-06-14 DIAGNOSIS — M79604 Pain in right leg: Secondary | ICD-10-CM | POA: Diagnosis not present

## 2016-06-14 DIAGNOSIS — Z0000 Encounter for general adult medical examination without abnormal findings: Secondary | ICD-10-CM | POA: Diagnosis not present

## 2016-06-14 DIAGNOSIS — K5080 Crohn's disease of both small and large intestine without complications: Secondary | ICD-10-CM | POA: Diagnosis not present

## 2016-06-24 DIAGNOSIS — M79604 Pain in right leg: Secondary | ICD-10-CM | POA: Diagnosis not present

## 2016-06-24 DIAGNOSIS — Z794 Long term (current) use of insulin: Secondary | ICD-10-CM | POA: Diagnosis not present

## 2016-06-24 DIAGNOSIS — G8929 Other chronic pain: Secondary | ICD-10-CM | POA: Diagnosis not present

## 2016-06-24 DIAGNOSIS — E1140 Type 2 diabetes mellitus with diabetic neuropathy, unspecified: Secondary | ICD-10-CM | POA: Diagnosis not present

## 2016-06-24 DIAGNOSIS — K5080 Crohn's disease of both small and large intestine without complications: Secondary | ICD-10-CM | POA: Diagnosis not present

## 2016-06-24 DIAGNOSIS — M79605 Pain in left leg: Secondary | ICD-10-CM | POA: Diagnosis not present

## 2016-09-17 DIAGNOSIS — M79605 Pain in left leg: Secondary | ICD-10-CM | POA: Diagnosis not present

## 2016-09-17 DIAGNOSIS — Z794 Long term (current) use of insulin: Secondary | ICD-10-CM | POA: Diagnosis not present

## 2016-09-17 DIAGNOSIS — E1140 Type 2 diabetes mellitus with diabetic neuropathy, unspecified: Secondary | ICD-10-CM | POA: Diagnosis not present

## 2016-09-17 DIAGNOSIS — K5080 Crohn's disease of both small and large intestine without complications: Secondary | ICD-10-CM | POA: Diagnosis not present

## 2016-09-17 DIAGNOSIS — M79604 Pain in right leg: Secondary | ICD-10-CM | POA: Diagnosis not present

## 2016-09-17 DIAGNOSIS — G8929 Other chronic pain: Secondary | ICD-10-CM | POA: Diagnosis not present

## 2016-09-21 ENCOUNTER — Other Ambulatory Visit: Payer: Self-pay | Admitting: General Surgery

## 2016-09-21 DIAGNOSIS — N6091 Unspecified benign mammary dysplasia of right breast: Secondary | ICD-10-CM

## 2016-10-01 DIAGNOSIS — E669 Obesity, unspecified: Secondary | ICD-10-CM | POA: Diagnosis not present

## 2016-10-01 DIAGNOSIS — E1169 Type 2 diabetes mellitus with other specified complication: Secondary | ICD-10-CM | POA: Diagnosis not present

## 2016-10-01 DIAGNOSIS — K5080 Crohn's disease of both small and large intestine without complications: Secondary | ICD-10-CM | POA: Diagnosis not present

## 2016-10-01 DIAGNOSIS — Z23 Encounter for immunization: Secondary | ICD-10-CM | POA: Diagnosis not present

## 2016-10-05 ENCOUNTER — Encounter: Payer: Self-pay | Admitting: *Deleted

## 2016-11-01 ENCOUNTER — Ambulatory Visit: Payer: PPO

## 2016-11-01 ENCOUNTER — Other Ambulatory Visit: Payer: PPO

## 2016-11-03 ENCOUNTER — Ambulatory Visit: Payer: PPO | Admitting: General Surgery

## 2016-11-09 ENCOUNTER — Ambulatory Visit: Payer: PPO | Admitting: General Surgery

## 2016-11-22 ENCOUNTER — Ambulatory Visit: Payer: PPO | Admitting: General Surgery

## 2016-11-22 ENCOUNTER — Ambulatory Visit
Admission: RE | Admit: 2016-11-22 | Discharge: 2016-11-22 | Disposition: A | Discharge status: Home / Self Care | Payer: PPO | Source: Ambulatory Visit | Attending: General Surgery | Admitting: General Surgery

## 2016-11-22 ENCOUNTER — Other Ambulatory Visit: Payer: Self-pay | Admitting: General Surgery

## 2016-11-22 DIAGNOSIS — R928 Other abnormal and inconclusive findings on diagnostic imaging of breast: Secondary | ICD-10-CM | POA: Diagnosis not present

## 2016-11-22 DIAGNOSIS — N6091 Unspecified benign mammary dysplasia of right breast: Secondary | ICD-10-CM

## 2016-11-25 ENCOUNTER — Encounter: Payer: Self-pay | Admitting: *Deleted

## 2016-11-30 ENCOUNTER — Encounter: Payer: Self-pay | Admitting: General Surgery

## 2016-11-30 ENCOUNTER — Ambulatory Visit (INDEPENDENT_AMBULATORY_CARE_PROVIDER_SITE_OTHER): Payer: PPO | Admitting: General Surgery

## 2016-11-30 VITALS — BP 130/70 | HR 72 | Resp 12 | Ht 65.0 in | Wt 181.0 lb

## 2016-11-30 DIAGNOSIS — N6091 Unspecified benign mammary dysplasia of right breast: Secondary | ICD-10-CM | POA: Diagnosis not present

## 2016-11-30 NOTE — Progress Notes (Signed)
Patient ID: Dorothy Murray, female   DOB: 1951/12/09, 65 y.o.   MRN: [id number]  Chief Complaint  Patient presents with  . Follow-up    HPI Dorothy Murray is a 65 y.o. female who presents for a breast ADH follow up .The most recent mammogram was done on 11/01/2016.   Patient does perform regular self breast checks and gets regular mammograms done. Patient declined Anti-hormonal therapy for prevention.Patient states she is having some troubles with pain in both legs,R>L, with walking, when she sits or lays down no pain. The pain is worst at the end of the day.  I have reviewed the history of present illness with the patient.  HPI  Past Medical History:  Diagnosis Date  . Crohn's disease ([place])   . 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 Diffuse cystic mastopathy   . Dysrhythmia     Past Surgical History:  Procedure Laterality Date  . BREAST BIOPSY Left 2007   stereo  . BREAST BIOPSY Bilateral 04/19/2016   stereo w/ Dr. Bary Castilla path pending  . BREAST EXCISIONAL BIOPSY Right 05/03/2016   exc bc for atypical ductal hyperplasia  . BREAST LUMPECTOMY Right 05/03/2016   ATYPICAL DUCTAL HYPERPLASIA (ADH) WITH MICROCALCIFICATIONS/: BREAST LUMPECTOMY; Ellwood Sayers, MD;  Location: [facility] ORS;  Service: General;  Laterality: Right;  . BREAST LUMPECTOMY WITH NEEDLE LOCALIZATION Right 05/03/2016   Procedure: BREAST LUMPECTOMY WITH NEEDLE LOCALIZATION;  Surgeon: Christene Lye, MD;  Location: [facility] ORS;  Service: General;  Laterality: Right;  . CHOLECYSTECTOMY    . COLONOSCOPY  2010, 2015   [facility]    Family History  Problem Relation Age of Onset  . Breast cancer Sister   . Breast cancer Daughter 42  . Heart attack Mother   . Heart attack Father   . Stomach cancer Brother   . Stomach cancer Sister     Social History Social History  Substance Use Topics  . Smoking status: Never Smoker  . Smokeless tobacco: Never Used  . Alcohol use No    No Known Allergies  Current  Outpatient Prescriptions  Medication Sig Dispense Refill  . aspirin EC 81 MG tablet Take by mouth.    . calcium carbonate (OS-CAL) 1250 (500 Ca) MG chewable tablet Chew 1 tablet by mouth daily.    Marland Kitchen CINNAMON PO Take 1 tablet by mouth daily.    . ergocalciferol (VITAMIN D2) 50000 units capsule Take 50,000 Units by mouth once a week.    Marland Kitchen GARLIC PO Take 1 tablet by mouth daily.    . insulin glargine (LANTUS) 100 UNIT/ML injection Inject 20 Units into the skin at bedtime.    . metFORMIN (GLUCOPHAGE) 1000 MG tablet Take 1,000 mg by mouth 2 (two) times daily.  5  . Omega-3 Fatty Acids (FISH OIL PO) Take 1 capsule by mouth 2 (two) times daily.     Marland Kitchen VICTOZA 18 MG/3ML SOPN INJECT 0.3 MLS (1.8 MG TOTAL) SUBCUTANEOUSLY ONCE DAILY.  5   No current facility-administered medications for this visit.     Review of Systems Review of Systems  Constitutional: Negative.   Respiratory: Negative.   Cardiovascular: Negative.     Blood pressure 130/70, pulse 72, resp. rate 12, height 5' 5"  (1.651 m), weight 181 lb (82.1 kg).  Physical Exam Physical Exam  Constitutional: She is oriented to person, place, and time. She appears well-developed and well-nourished.  Eyes: Conjunctivae are normal. No scleral icterus.  Neck: Neck supple.  Cardiovascular:  Normal rate, regular rhythm and normal heart sounds.   Pulses:      Dorsalis pedis pulses are 2+ on the right side, and 2+ on the left side.       Posterior tibial pulses are 2+ on the right side, and 2+ on the left side.  Feet are warm and pink with brisk capillary refill. Few VV seen back of both calves. No leg edema or skin changes  Pulmonary/Chest: Effort normal and breath sounds normal. Right breast exhibits no inverted nipple, no mass, no nipple discharge, no skin change and no tenderness. Left breast exhibits no inverted nipple, no mass, no nipple discharge, no skin change and no tenderness.  Abdominal: Soft. Bowel sounds are normal. There is no  tenderness.  Lymphadenopathy:    She has no cervical adenopathy.    She has no axillary adenopathy.  Neurological: She is alert and oriented to person, place, and time.  Skin: Skin is warm and dry.    Data Reviewed Right breast mammogram reviewed -stable  Assessment    ADH right breast, post lumpectomy. VV mild.     Plan   Rx given- calf length stockings with ankle pr of 4mHG. Patient to use compassion hose daily.  Return in one month for leg check and ultrasound.  Patient to return in six month with bilateral screening mammogram.      This information has been scribed by JGaspar ColaCMA.    Dorothy Murray 11/30/2016, 2:29 PM

## 2016-11-30 NOTE — Patient Instructions (Signed)
Patient to use compassion hose daily. Return in one month for leg check and ultrasound. Patient to return in six month for bilateral screening mammogram.

## 2017-01-04 ENCOUNTER — Ambulatory Visit: Payer: PPO | Admitting: General Surgery

## 2017-01-24 DIAGNOSIS — Z23 Encounter for immunization: Secondary | ICD-10-CM | POA: Diagnosis not present

## 2017-01-24 DIAGNOSIS — E1169 Type 2 diabetes mellitus with other specified complication: Secondary | ICD-10-CM | POA: Diagnosis not present

## 2017-01-24 DIAGNOSIS — K5080 Crohn's disease of both small and large intestine without complications: Secondary | ICD-10-CM | POA: Diagnosis not present

## 2017-01-24 DIAGNOSIS — E669 Obesity, unspecified: Secondary | ICD-10-CM | POA: Diagnosis not present

## 2017-01-31 DIAGNOSIS — K5080 Crohn's disease of both small and large intestine without complications: Secondary | ICD-10-CM | POA: Diagnosis not present

## 2017-01-31 DIAGNOSIS — E669 Obesity, unspecified: Secondary | ICD-10-CM | POA: Diagnosis not present

## 2017-01-31 DIAGNOSIS — E1169 Type 2 diabetes mellitus with other specified complication: Secondary | ICD-10-CM | POA: Diagnosis not present

## 2017-01-31 DIAGNOSIS — E781 Pure hyperglyceridemia: Secondary | ICD-10-CM | POA: Diagnosis not present

## 2017-01-31 DIAGNOSIS — M5441 Lumbago with sciatica, right side: Secondary | ICD-10-CM | POA: Diagnosis not present

## 2017-01-31 DIAGNOSIS — Z0000 Encounter for general adult medical examination without abnormal findings: Secondary | ICD-10-CM | POA: Diagnosis not present

## 2017-01-31 DIAGNOSIS — G8929 Other chronic pain: Secondary | ICD-10-CM | POA: Diagnosis not present

## 2017-03-24 ENCOUNTER — Other Ambulatory Visit: Payer: Self-pay

## 2017-03-24 DIAGNOSIS — Z1231 Encounter for screening mammogram for malignant neoplasm of breast: Secondary | ICD-10-CM

## 2017-04-04 IMAGING — MG MM PLC BREAST LOC DEV 1ST LESION INC*R*
6 series · 6 of 6 positions shown · non-contrast
Comparison: Previous exams.

CLINICAL DATA: 64-year-old female for wire localization of biopsy
site demonstrating atypical ductal hyperplasia

EXAM:
NEEDLE LOCALIZATION OF THE RIGHT BREAST WITH MAMMO GUIDANCE

[R CC (1 of 2)]
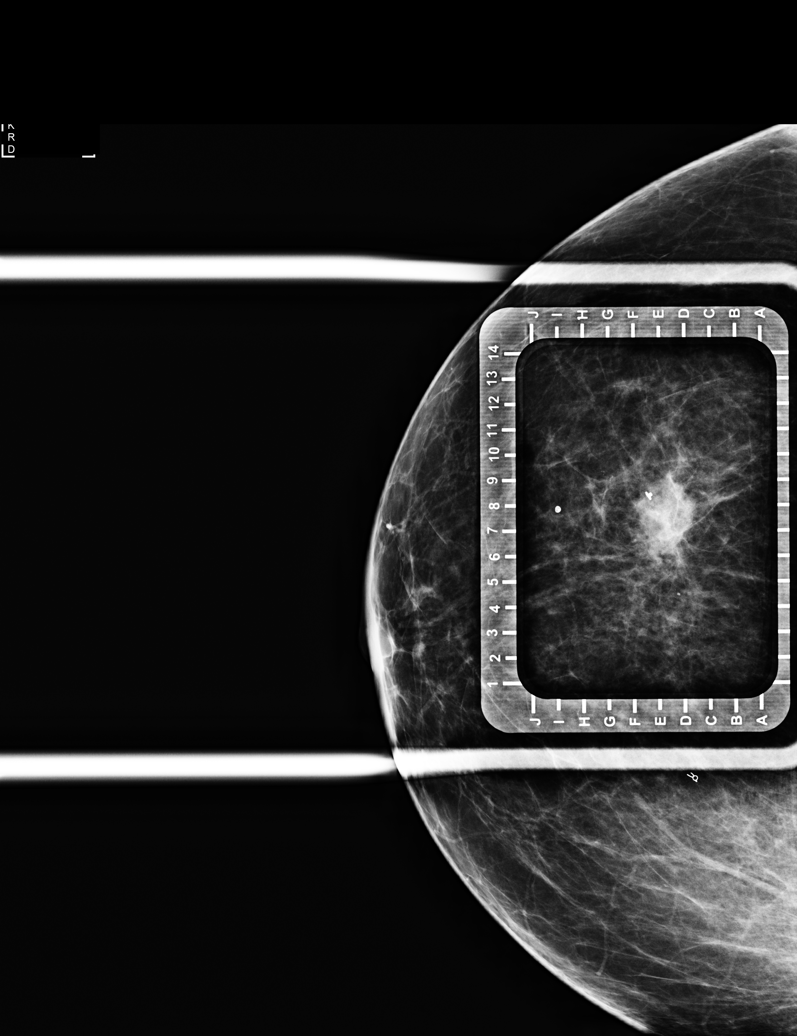

[R ML (1 of 4)]
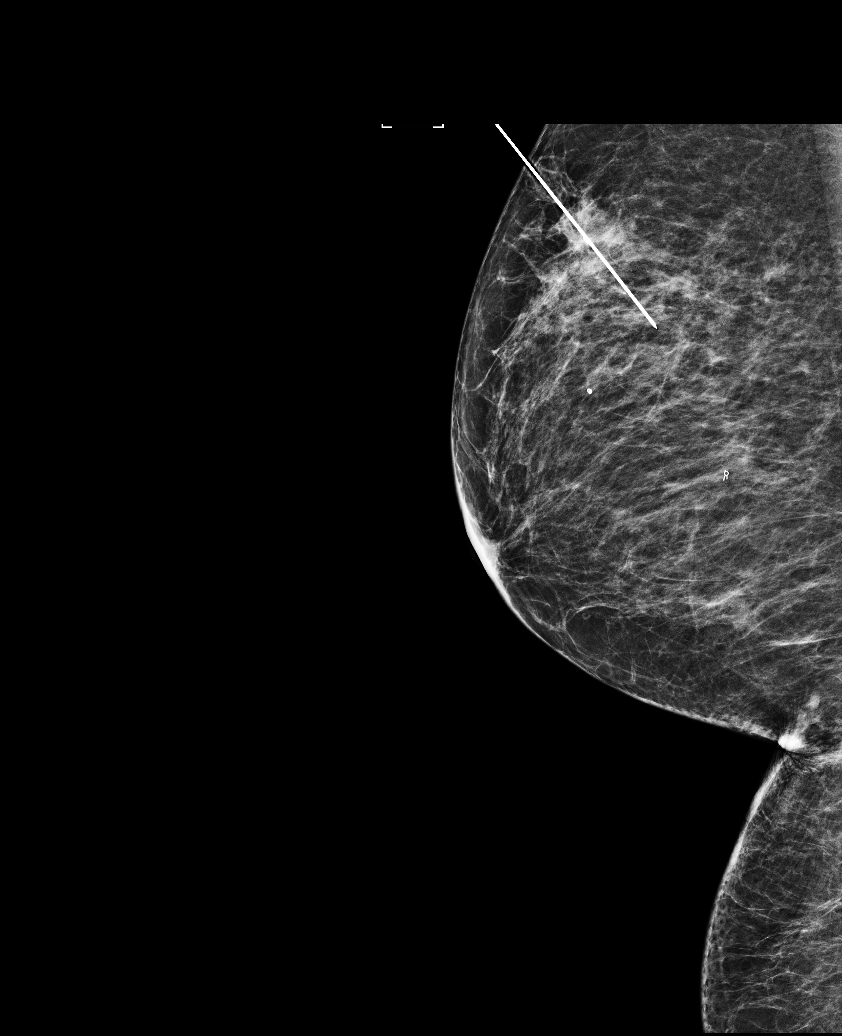

[R ML (2 of 4)]
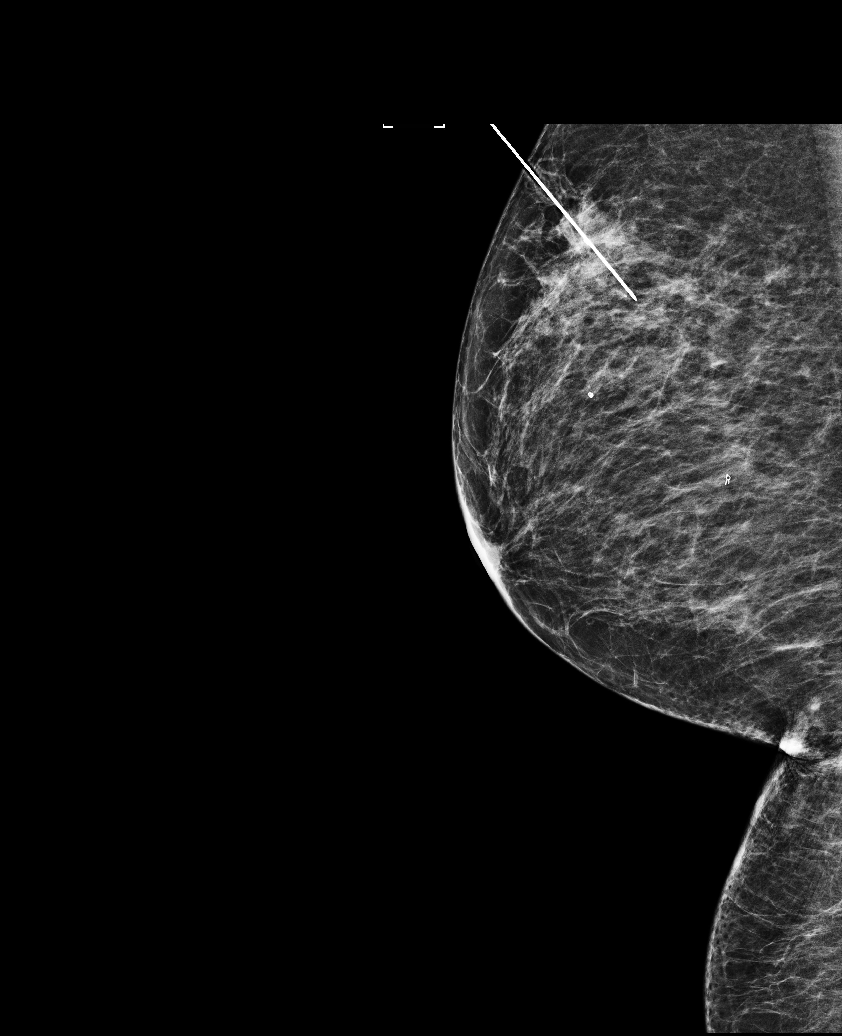

[R ML (3 of 4)]
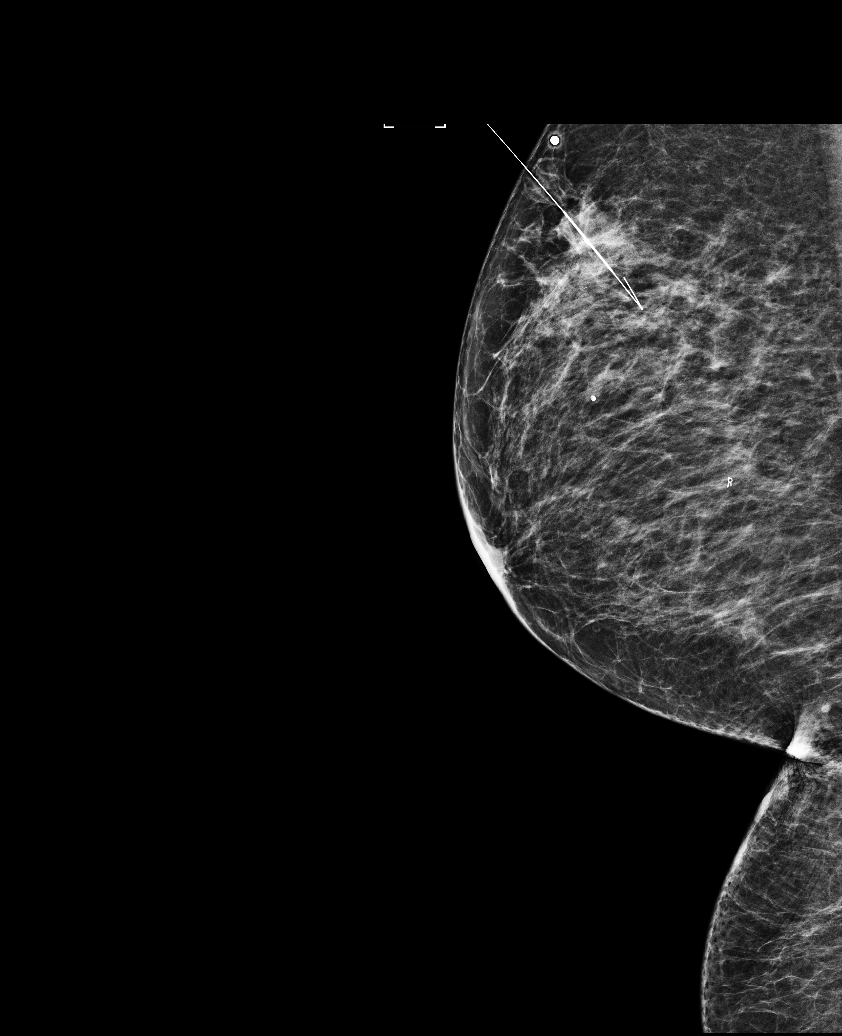

[R ML (4 of 4)]
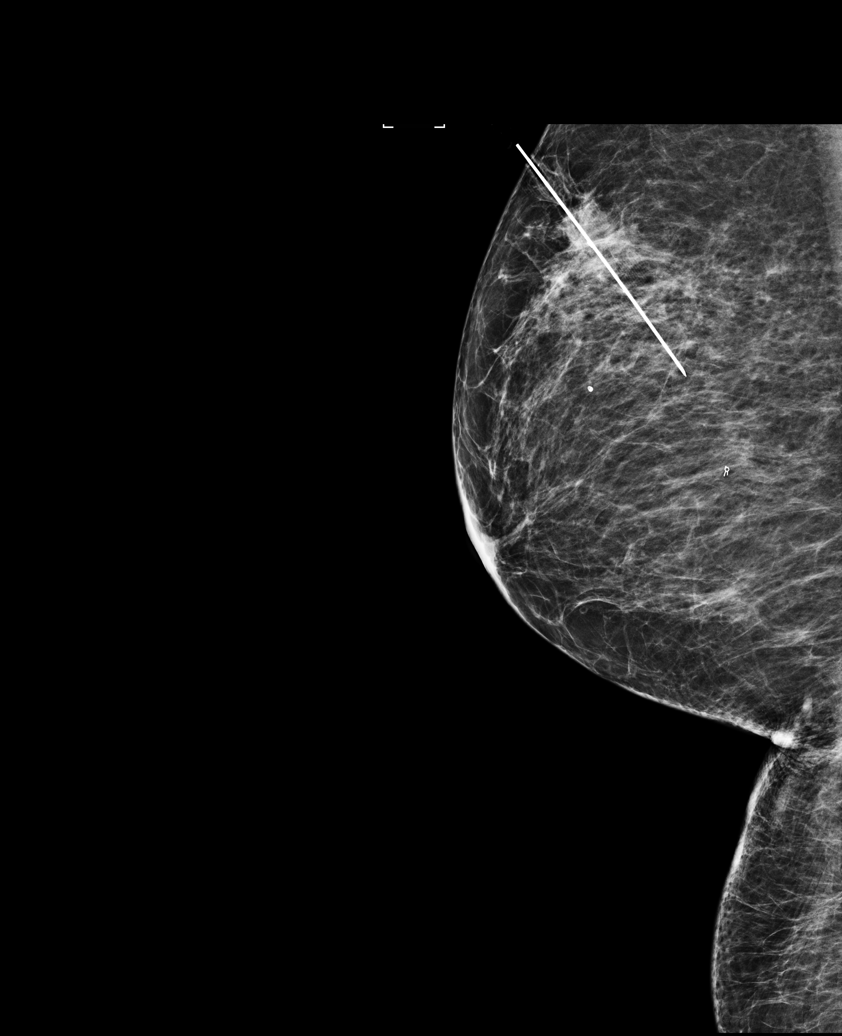

[R CC (2 of 2)]
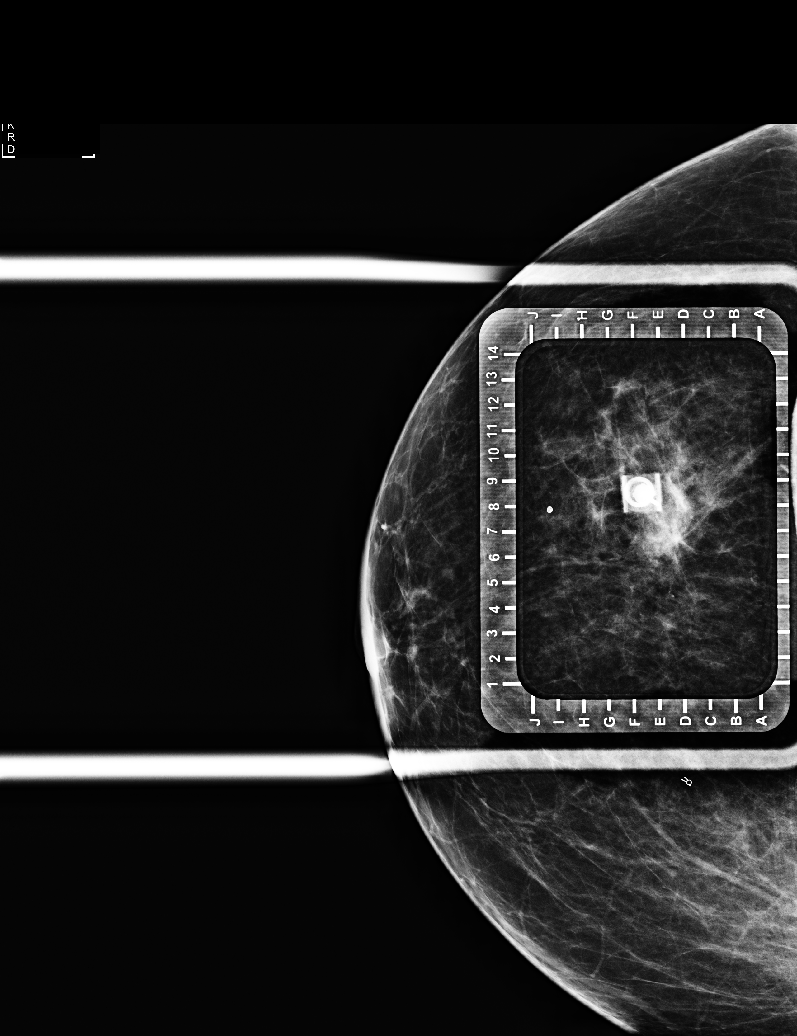

[6 of 6 positions shown; findings below may reference images not displayed]

FINDINGS: Patient presents for needle localization prior to right excisional
biopsy. I met with the patient and we discussed the procedure of
needle localization including benefits and alternatives. We
discussed the high likelihood of a successful procedure. We
discussed the risks of the procedure, including infection, bleeding,
tissue injury, and further surgery. Informed, written consent was
given. The usual time-out protocol was performed immediately prior
to the procedure.

Using mammographic guidance, sterile technique, 1% lidocaine and a 7
cm modified Kopans needle, the top hat shaped biopsy marker in the
upper, outer right breast was localized using a superior to inferior
approach. The images were marked for Dr. Quirijn.
IMPRESSION: Needle localization of the right breast. No apparent complications.

## 2017-05-13 DIAGNOSIS — E1169 Type 2 diabetes mellitus with other specified complication: Secondary | ICD-10-CM | POA: Diagnosis not present

## 2017-05-13 DIAGNOSIS — E669 Obesity, unspecified: Secondary | ICD-10-CM | POA: Diagnosis not present

## 2017-05-13 DIAGNOSIS — E781 Pure hyperglyceridemia: Secondary | ICD-10-CM | POA: Diagnosis not present

## 2017-05-13 DIAGNOSIS — M5441 Lumbago with sciatica, right side: Secondary | ICD-10-CM | POA: Diagnosis not present

## 2017-05-13 DIAGNOSIS — G8929 Other chronic pain: Secondary | ICD-10-CM | POA: Diagnosis not present

## 2017-05-13 DIAGNOSIS — K5080 Crohn's disease of both small and large intestine without complications: Secondary | ICD-10-CM | POA: Diagnosis not present

## 2017-05-17 ENCOUNTER — Ambulatory Visit
Admission: RE | Admit: 2017-05-17 | Discharge: 2017-05-17 | Disposition: A | Discharge status: Home / Self Care | Payer: PPO | Source: Ambulatory Visit | Attending: General Surgery | Admitting: General Surgery

## 2017-05-17 DIAGNOSIS — Z1231 Encounter for screening mammogram for malignant neoplasm of breast: Secondary | ICD-10-CM | POA: Diagnosis not present

## 2017-05-20 DIAGNOSIS — E538 Deficiency of other specified B group vitamins: Secondary | ICD-10-CM | POA: Diagnosis not present

## 2017-05-20 DIAGNOSIS — M79605 Pain in left leg: Secondary | ICD-10-CM | POA: Diagnosis not present

## 2017-05-20 DIAGNOSIS — R262 Difficulty in walking, not elsewhere classified: Secondary | ICD-10-CM | POA: Diagnosis not present

## 2017-05-20 DIAGNOSIS — Z794 Long term (current) use of insulin: Secondary | ICD-10-CM | POA: Diagnosis not present

## 2017-05-20 DIAGNOSIS — E1140 Type 2 diabetes mellitus with diabetic neuropathy, unspecified: Secondary | ICD-10-CM | POA: Diagnosis not present

## 2017-05-24 ENCOUNTER — Encounter: Payer: Self-pay | Admitting: *Deleted

## 2017-05-25 ENCOUNTER — Other Ambulatory Visit: Payer: Self-pay | Admitting: Internal Medicine

## 2017-05-25 DIAGNOSIS — M79605 Pain in left leg: Secondary | ICD-10-CM

## 2017-05-25 DIAGNOSIS — R262 Difficulty in walking, not elsewhere classified: Secondary | ICD-10-CM

## 2017-05-26 ENCOUNTER — Ambulatory Visit: Payer: PPO | Admitting: General Surgery

## 2017-05-30 ENCOUNTER — Encounter: Payer: Self-pay | Admitting: General Surgery

## 2017-05-30 ENCOUNTER — Ambulatory Visit (INDEPENDENT_AMBULATORY_CARE_PROVIDER_SITE_OTHER): Payer: PPO | Admitting: General Surgery

## 2017-05-30 VITALS — BP 128/74 | HR 78 | Resp 12 | Ht 65.0 in | Wt 175.0 lb

## 2017-05-30 DIAGNOSIS — N6091 Unspecified benign mammary dysplasia of right breast: Secondary | ICD-10-CM | POA: Diagnosis not present

## 2017-05-30 DIAGNOSIS — Z803 Family history of malignant neoplasm of breast: Secondary | ICD-10-CM | POA: Diagnosis not present

## 2017-05-30 NOTE — Patient Instructions (Signed)
Patient to return to clinic in 6 months for follow up. Advise to call office with any complaints or questions.

## 2017-05-30 NOTE — Progress Notes (Signed)
Patient ID: Dorothy Murray, female   DOB: 1951-09-30, 66 y.o.   MRN: [id number]  Chief Complaint  Patient presents with  . Follow-up    mammogram    HPI Dorothy Murray is a 66 y.o. female who presents for a breast ADH follow up. The most recent mammogram was done on 05/17/17.  Patient does perform regular self breast checks and gets regular mammograms done. Patient states no new breast issues.    HPI  Past Medical History:  Diagnosis Date  . Crohn's disease ([place])   . 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 Diffuse cystic mastopathy   . Dysrhythmia     Past Surgical History:  Procedure Laterality Date  . BREAST BIOPSY Left 2007   stereo  . BREAST BIOPSY Bilateral 04/19/2016   stereo w/ Dr. Bary Castilla path pending  . BREAST EXCISIONAL BIOPSY Right 05/03/2016   exc bc for atypical ductal hyperplasia  . BREAST LUMPECTOMY Right 05/03/2016   ATYPICAL DUCTAL HYPERPLASIA (ADH) WITH MICROCALCIFICATIONS/: BREAST LUMPECTOMY; Ellwood Sayers, MD;  Location: [facility] ORS;  Service: General;  Laterality: Right;  . BREAST LUMPECTOMY WITH NEEDLE LOCALIZATION Right 05/03/2016   Procedure: BREAST LUMPECTOMY WITH NEEDLE LOCALIZATION;  Surgeon: Christene Lye, MD;  Location: [facility] ORS;  Service: General;  Laterality: Right;  . CHOLECYSTECTOMY    . COLONOSCOPY  2010, 2015   [facility]    Family History  Problem Relation Age of Onset  . Breast cancer Sister   . Breast cancer Daughter 48  . Heart attack Mother   . Heart attack Father   . Stomach cancer Brother   . Stomach cancer Sister     Social History Social History  Substance Use Topics  . Smoking status: Never Smoker  . Smokeless tobacco: Never Used  . Alcohol use No    No Known Allergies  Current Outpatient Prescriptions  Medication Sig Dispense Refill  . aspirin EC 81 MG tablet Take by mouth.    . calcium carbonate (OS-CAL) 1250 (500 Ca) MG chewable tablet Chew 1 tablet by mouth daily.    Marland Kitchen CINNAMON PO Take 1 tablet by  mouth daily.    . ergocalciferol (VITAMIN D2) 50000 units capsule Take 50,000 Units by mouth once a week.    . gabapentin (NEURONTIN) 100 MG capsule Take 50 mg by mouth 2 (two) times daily.    Marland Kitchen GARLIC PO Take 1 tablet by mouth daily.    . insulin glargine (LANTUS) 100 UNIT/ML injection Inject 20 Units into the skin at bedtime.    . metFORMIN (GLUCOPHAGE) 1000 MG tablet Take 1,000 mg by mouth 2 (two) times daily.  5  . Omega-3 Fatty Acids (FISH OIL PO) Take 1 capsule by mouth 2 (two) times daily.     Marland Kitchen VICTOZA 18 MG/3ML SOPN INJECT 0.3 MLS (1.8 MG TOTAL) SUBCUTANEOUSLY ONCE DAILY.  5   No current facility-administered medications for this visit.     Review of Systems Review of Systems  Constitutional: Negative.   Respiratory: Negative.   Cardiovascular: Negative.     Blood pressure 128/74, pulse 78, resp. rate 12, height 5' 5"  (1.651 m), weight 175 lb (79.4 kg).  Physical Exam Physical Exam  Constitutional: She is oriented to person, place, and time. She appears well-developed and well-nourished.  Eyes: Conjunctivae are normal. No scleral icterus.  Neck: Neck supple. No thyromegaly present.  Cardiovascular: Normal rate, regular rhythm and normal heart sounds.   Pulmonary/Chest: Effort normal and breath sounds normal.  Right breast exhibits no inverted nipple, no mass, no nipple discharge, no skin change and no tenderness. Left breast exhibits no inverted nipple, no mass, no nipple discharge, no skin change and no tenderness.  Abdominal: Soft. Normal appearance and bowel sounds are normal.  Lymphadenopathy:    She has no cervical adenopathy.    She has no axillary adenopathy.  Neurological: She is alert and oriented to person, place, and time.  Skin: Skin is warm and dry.  Psychiatric: She has a normal mood and affect. Her behavior is normal.    Data Reviewed Prior notes and bilateral screening mammogram reviewed.    Assessment    ADH of right breast- s/p lumpectomy. VV  mild. Family history of breast cancer  Patient has declined anti hormonal therapy    Plan    Patient to return to clinic in 6 months for follow up. Advise to call office with any complaints or questions.     HPI, Physical Exam, Assessment and Plan have been scribed under the direction and in the presence of Mckinley Jewel, MD.  Verlene Mayer, CMA  I have completed the exam and reviewed the above documentation for accuracy and completeness.  I agree with the above.  Haematologist has been used and any errors in dictation or transcription are unintentional.  Kyan Yurkovich G. Jamal Collin, M.D., F.A.C.S.   Junie Panning G 05/30/2017, 9:53 AM

## 2017-06-03 ENCOUNTER — Ambulatory Visit
Admission: RE | Admit: 2017-06-03 | Discharge: 2017-06-03 | Disposition: A | Discharge status: Home / Self Care | Payer: PPO | Source: Ambulatory Visit | Attending: Internal Medicine | Admitting: Internal Medicine

## 2017-06-03 DIAGNOSIS — M48061 Spinal stenosis, lumbar region without neurogenic claudication: Secondary | ICD-10-CM | POA: Diagnosis not present

## 2017-06-03 DIAGNOSIS — M79605 Pain in left leg: Secondary | ICD-10-CM | POA: Diagnosis not present

## 2017-06-03 DIAGNOSIS — M8938 Hypertrophy of bone, other site: Secondary | ICD-10-CM | POA: Diagnosis not present

## 2017-06-03 DIAGNOSIS — M5126 Other intervertebral disc displacement, lumbar region: Secondary | ICD-10-CM | POA: Insufficient documentation

## 2017-06-03 DIAGNOSIS — R262 Difficulty in walking, not elsewhere classified: Secondary | ICD-10-CM | POA: Insufficient documentation

## 2017-06-03 DIAGNOSIS — R600 Localized edema: Secondary | ICD-10-CM | POA: Diagnosis not present

## 2017-06-03 MED ORDER — GADOBENATE DIMEGLUMINE 529 MG/ML IV SOLN
15.0000 mL | Freq: Once | INTRAVENOUS | Status: AC | PRN
  Administered 2017-06-03: 15 mL via INTRAVENOUS

## 2017-06-16 DIAGNOSIS — M47816 Spondylosis without myelopathy or radiculopathy, lumbar region: Secondary | ICD-10-CM | POA: Diagnosis not present

## 2017-06-16 DIAGNOSIS — M48062 Spinal stenosis, lumbar region with neurogenic claudication: Secondary | ICD-10-CM | POA: Diagnosis not present

## 2017-06-28 DIAGNOSIS — R262 Difficulty in walking, not elsewhere classified: Secondary | ICD-10-CM | POA: Diagnosis not present

## 2017-06-28 DIAGNOSIS — M79605 Pain in left leg: Secondary | ICD-10-CM | POA: Diagnosis not present

## 2017-06-28 DIAGNOSIS — M79604 Pain in right leg: Secondary | ICD-10-CM | POA: Diagnosis not present

## 2017-07-05 DIAGNOSIS — M79605 Pain in left leg: Secondary | ICD-10-CM | POA: Diagnosis not present

## 2017-07-05 DIAGNOSIS — M79604 Pain in right leg: Secondary | ICD-10-CM | POA: Diagnosis not present

## 2017-07-08 DIAGNOSIS — M79605 Pain in left leg: Secondary | ICD-10-CM | POA: Diagnosis not present

## 2017-07-08 DIAGNOSIS — M79604 Pain in right leg: Secondary | ICD-10-CM | POA: Diagnosis not present

## 2017-07-12 DIAGNOSIS — M79604 Pain in right leg: Secondary | ICD-10-CM | POA: Diagnosis not present

## 2017-07-12 DIAGNOSIS — M79605 Pain in left leg: Secondary | ICD-10-CM | POA: Diagnosis not present

## 2017-07-14 DIAGNOSIS — M79604 Pain in right leg: Secondary | ICD-10-CM | POA: Diagnosis not present

## 2017-07-14 DIAGNOSIS — M79605 Pain in left leg: Secondary | ICD-10-CM | POA: Diagnosis not present

## 2017-07-18 DIAGNOSIS — M79605 Pain in left leg: Secondary | ICD-10-CM | POA: Diagnosis not present

## 2017-07-18 DIAGNOSIS — M79604 Pain in right leg: Secondary | ICD-10-CM | POA: Diagnosis not present

## 2017-07-22 DIAGNOSIS — M79604 Pain in right leg: Secondary | ICD-10-CM | POA: Diagnosis not present

## 2017-07-22 DIAGNOSIS — M79605 Pain in left leg: Secondary | ICD-10-CM | POA: Diagnosis not present

## 2017-07-28 ENCOUNTER — Emergency Department
Admission: EM | Admit: 2017-07-28 | Discharge: 2017-07-29 | Disposition: A | Discharge status: Home / Self Care | Payer: PPO | Attending: Emergency Medicine | Admitting: Emergency Medicine

## 2017-07-28 ENCOUNTER — Encounter: Payer: Self-pay | Admitting: Emergency Medicine

## 2017-07-28 DIAGNOSIS — L509 Urticaria, unspecified: Secondary | ICD-10-CM | POA: Diagnosis not present

## 2017-07-28 DIAGNOSIS — E119 Type 2 diabetes mellitus without complications: Secondary | ICD-10-CM | POA: Insufficient documentation

## 2017-07-28 MED ORDER — DIPHENHYDRAMINE HCL 50 MG/ML IJ SOLN
50.0000 mg | Freq: Once | INTRAMUSCULAR | Status: AC
  Administered 2017-07-28: 50 mg via INTRAVENOUS
  Filled 2017-07-28: qty 1

## 2017-07-28 MED ORDER — DIPHENHYDRAMINE HCL 25 MG PO CAPS: 25.0000 mg | ORAL_CAPSULE | ORAL | 2 refills | Status: DC | PRN

## 2017-07-28 MED ORDER — FAMOTIDINE IN NACL 20-0.9 MG/50ML-% IV SOLN
20.0000 mg | Freq: Once | INTRAVENOUS | Status: AC
  Administered 2017-07-28: 20 mg via INTRAVENOUS
  Filled 2017-07-28: qty 50

## 2017-07-28 MED ORDER — PREDNISONE 50 MG PO TABS: ORAL_TABLET | ORAL | 0 refills | Status: DC

## 2017-07-28 MED ORDER — METHYLPREDNISOLONE SODIUM SUCC 125 MG IJ SOLR
125.0000 mg | Freq: Once | INTRAMUSCULAR | Status: AC
  Administered 2017-07-28: 125 mg via INTRAVENOUS
  Filled 2017-07-28: qty 2

## 2017-07-28 NOTE — Discharge Instructions (Addendum)
Take prednisone and Benadryl as directed. Return to the ER for worsening symptoms, persistent vomiting, difficulty breathing, facial swelling or other concerns.

## 2017-07-28 NOTE — ED Provider Notes (Signed)
[facility] Emergency Department Provider Note       Time seen: ----------------------------------------- 9:47 PM on 07/28/2017 -----------------------------------------     I have reviewed the triage vital signs and the nursing notes.   HISTORY   Chief Complaint Oral Swelling and Urticaria    HPI Dorothy Murray is a 66 y.o. female who presents to the ED for hives and swelling of her lip that began approximately 30-45 minutes prior to arrival. Patient states she's having no trouble breathing but is possibly having some trouble swallowing. She states she has not had this happen before, nothing makes it better or worse. The rash is generalized, she denies changes in soaps, detergents, shampoos, foods or medicines.   Past Medical History:  Diagnosis Date  . Crohn's disease ([place])   . 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 Diffuse cystic mastopathy   . Dysrhythmia     There are no active problems to display for this patient.   Past Surgical History:  Procedure Laterality Date  . BREAST BIOPSY Bilateral 04/19/2016   stereo w/ Dr. Bary Castilla path pending  . BREAST LUMPECTOMY Right 05/03/2016   ATYPICAL DUCTAL HYPERPLASIA (ADH) WITH MICROCALCIFICATIONS/: BREAST LUMPECTOMY; Ellwood Sayers, MD;  Location: [facility] ORS;  Service: General;  Laterality: Right;  . BREAST LUMPECTOMY WITH NEEDLE LOCALIZATION Right 05/03/2016   Procedure: BREAST LUMPECTOMY WITH NEEDLE LOCALIZATION;  Surgeon: Christene Lye, MD;  Location: [facility] ORS;  Service: General;  Laterality: Right;  . CHOLECYSTECTOMY    . COLONOSCOPY  2010, 2015   [facility]    Allergies Patient has no known allergies.  Social History Social History  Substance Use Topics  . Smoking status: Never Smoker  . Smokeless tobacco: Never Used  . Alcohol use No    Review of Systems Constitutional: Negative for fever. Eyes: Negative for vision changes ENT:  Negative for congestion, sore throat.  Positive for lip swelling. Cardiovascular: Negative for chest pain. Respiratory: Negative for shortness of breath. Gastrointestinal: Negative for abdominal pain, vomiting and diarrhea. Genitourinary: Negative for dysuria. Musculoskeletal: Negative for back pain. Skin: Positive for rash Neurological: Negative for headaches, focal weakness or numbness.  All systems negative/normal/unremarkable except as stated in the HPI  ____________________________________________   PHYSICAL EXAM:  VITAL SIGNS: ED Triage Vitals [07/28/17 2142]  Enc Vitals Group     BP (!) 178/88     Pulse Rate (!) 116     Resp 18     Temp 98.5 F (36.9 C)     Temp Source Oral     SpO2 97 %     Weight 175 lb (79.4 kg)     Height 5' (1.524 m)     Head Circumference      Peak Flow      Pain Score 0     Pain Loc      Pain Edu?      Excl. in [place]?     Constitutional: Alert and oriented. Well appearing and in no distress. Eyes: Conjunctivae are normal. Normal extraocular movements. ENT   Head: Normocephalic and atraumatic.   Nose: No congestion/rhinnorhea.   Mouth/Throat: Mucous membranes are moist. Minimal right lower lip swelling   Neck: No stridor. Cardiovascular: Normal rate, regular rhythm. No murmurs, rubs, or gallops. Respiratory: Normal respiratory effort without tachypnea nor retractions. Breath sounds are clear and equal bilaterally. No wheezes/rales/rhonchi. Gastrointestinal: Soft and nontender. Normal bowel sounds Musculoskeletal: Nontender with normal range of motion in extremities. No lower extremity tenderness  nor edema. Neurologic:  Normal speech and language. No gross focal neurologic deficits are appreciated.  Skin: Urticaria is appreciated over the trunk and extremities, worse on the chest and abdomen Psychiatric: Mood and affect are normal. Speech and behavior are normal.  ____________________________________________  ED COURSE:  Pertinent labs & imaging results that were  available during my care of the patient were reviewed by me and considered in my medical decision making (see chart for details). Patient presents for urticaria, she will receive IV Benadryl, Pepcid and symmetrical. Clinical Course as of Jul 28 2320  Thu Jul 28, 2017  2243 Urticaria appears improved at this time.  [JW]    Clinical Course User Index [JW] Earleen Newport, MD   Procedures ____________________________________________  FINAL ASSESSMENT AND PLAN  Urticaria  Plan:  Patient had presented for Urticaria and potentially tongue swelling although no swelling was visualized. There was slight swelling to the right lower lip and the symptoms have currently resolved. We will continue to observe in the ER.   Earleen Newport, MD   Note: This note was generated in part or whole with voice recognition software. Voice recognition is usually quite accurate but there are transcription errors that can and very often do occur. I apologize for any typographical errors that were not detected and corrected.     Earleen Newport, MD 07/28/17 2322

## 2017-07-28 NOTE — ED Triage Notes (Signed)
Pt ambulatory to triage in NAD, reports hives and tongue swelling about 30-27mn PTA, reports breathing okay but difficulty swallowing.  Pt denies difficulty speaking, but states can feel her tongue is not normal.  Hives seen on pt's arms, legs, and torso.

## 2017-07-28 NOTE — ED Notes (Signed)
Pt states "i feel a lot better"

## 2017-07-29 DIAGNOSIS — M79604 Pain in right leg: Secondary | ICD-10-CM | POA: Diagnosis not present

## 2017-07-29 DIAGNOSIS — M79605 Pain in left leg: Secondary | ICD-10-CM | POA: Diagnosis not present

## 2017-07-29 NOTE — ED Provider Notes (Signed)
-----------------------------------------   12:51 AM on 07/29/2017 -----------------------------------------  Hives resolved. Patient feeling much better. Strict return precautions given. Patient is spouse verbalize understanding and agree with plan of care.   Paulette Blanch, MD 07/29/17 [phone]

## 2017-08-03 DIAGNOSIS — M48062 Spinal stenosis, lumbar region with neurogenic claudication: Secondary | ICD-10-CM | POA: Diagnosis not present

## 2017-08-03 DIAGNOSIS — M5136 Other intervertebral disc degeneration, lumbar region: Secondary | ICD-10-CM | POA: Diagnosis not present

## 2017-08-03 DIAGNOSIS — M5416 Radiculopathy, lumbar region: Secondary | ICD-10-CM | POA: Diagnosis not present

## 2017-08-22 DIAGNOSIS — R262 Difficulty in walking, not elsewhere classified: Secondary | ICD-10-CM | POA: Diagnosis not present

## 2017-08-22 DIAGNOSIS — M79605 Pain in left leg: Secondary | ICD-10-CM | POA: Diagnosis not present

## 2017-08-22 DIAGNOSIS — E1140 Type 2 diabetes mellitus with diabetic neuropathy, unspecified: Secondary | ICD-10-CM | POA: Diagnosis not present

## 2017-08-22 DIAGNOSIS — Z794 Long term (current) use of insulin: Secondary | ICD-10-CM | POA: Diagnosis not present

## 2017-08-25 DIAGNOSIS — M48062 Spinal stenosis, lumbar region with neurogenic claudication: Secondary | ICD-10-CM | POA: Diagnosis not present

## 2017-09-05 DIAGNOSIS — E119 Type 2 diabetes mellitus without complications: Secondary | ICD-10-CM | POA: Diagnosis not present

## 2017-09-05 DIAGNOSIS — K5080 Crohn's disease of both small and large intestine without complications: Secondary | ICD-10-CM | POA: Diagnosis not present

## 2017-09-05 DIAGNOSIS — Z23 Encounter for immunization: Secondary | ICD-10-CM | POA: Diagnosis not present

## 2017-09-05 DIAGNOSIS — M5416 Radiculopathy, lumbar region: Secondary | ICD-10-CM | POA: Diagnosis not present

## 2017-09-05 DIAGNOSIS — M48061 Spinal stenosis, lumbar region without neurogenic claudication: Secondary | ICD-10-CM | POA: Diagnosis not present

## 2017-09-09 DIAGNOSIS — M5136 Other intervertebral disc degeneration, lumbar region: Secondary | ICD-10-CM | POA: Diagnosis not present

## 2017-09-09 DIAGNOSIS — M5416 Radiculopathy, lumbar region: Secondary | ICD-10-CM | POA: Diagnosis not present

## 2017-09-09 DIAGNOSIS — M48062 Spinal stenosis, lumbar region with neurogenic claudication: Secondary | ICD-10-CM | POA: Diagnosis not present

## 2017-10-31 DIAGNOSIS — Z6829 Body mass index (BMI) 29.0-29.9, adult: Secondary | ICD-10-CM | POA: Diagnosis not present

## 2017-10-31 DIAGNOSIS — M4716 Other spondylosis with myelopathy, lumbar region: Secondary | ICD-10-CM | POA: Diagnosis not present

## 2017-10-31 DIAGNOSIS — M48062 Spinal stenosis, lumbar region with neurogenic claudication: Secondary | ICD-10-CM | POA: Diagnosis not present

## 2017-10-31 DIAGNOSIS — M4316 Spondylolisthesis, lumbar region: Secondary | ICD-10-CM | POA: Diagnosis not present

## 2017-11-28 ENCOUNTER — Encounter: Payer: Self-pay | Admitting: General Surgery

## 2017-11-28 ENCOUNTER — Ambulatory Visit (INDEPENDENT_AMBULATORY_CARE_PROVIDER_SITE_OTHER): Payer: PPO | Admitting: General Surgery

## 2017-11-28 VITALS — BP 138/88 | HR 78 | Resp 14 | Ht 65.0 in | Wt 176.0 lb

## 2017-11-28 DIAGNOSIS — Z803 Family history of malignant neoplasm of breast: Secondary | ICD-10-CM | POA: Diagnosis not present

## 2017-11-28 DIAGNOSIS — N6091 Unspecified benign mammary dysplasia of right breast: Secondary | ICD-10-CM | POA: Diagnosis not present

## 2017-11-28 NOTE — Progress Notes (Signed)
Patient ID: Dorothy Murray, female   DOB: 1951-02-26, 66 y.o.   MRN: [id number]  Chief Complaint  Patient presents with  . Follow-up    HPI Dorothy Murray is a 66 y.o. female with ADH for a breast follow up. She denies any new breast problems. Her last mammogram was 05/17/17. Her Son was diagnosed with colon cancer.  HPI  Past Medical History:  Diagnosis Date  . Crohn's disease ([place])   . 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 Diffuse cystic mastopathy   . Dysrhythmia     Past Surgical History:  Procedure Laterality Date  . BREAST BIOPSY Bilateral 04/19/2016   stereo w/ Dr. Bary Castilla path pending  . BREAST LUMPECTOMY Right 05/03/2016   ATYPICAL DUCTAL HYPERPLASIA (ADH) WITH MICROCALCIFICATIONS/: BREAST LUMPECTOMY; Ellwood Sayers, MD;  Location: [facility] ORS;  Service: General;  Laterality: Right;  . BREAST LUMPECTOMY WITH NEEDLE LOCALIZATION Right 05/03/2016   Procedure: BREAST LUMPECTOMY WITH NEEDLE LOCALIZATION;  Surgeon: Christene Lye, MD;  Location: [facility] ORS;  Service: General;  Laterality: Right;  . CHOLECYSTECTOMY    . COLONOSCOPY  2010, 2015   [facility]    Family History  Problem Relation Age of Onset  . Breast cancer Sister   . Breast cancer Daughter 70  . Heart attack Mother   . Heart attack Father   . Stomach cancer Brother   . Stomach cancer Sister   . Colon cancer Son     Social History Social History   Tobacco Use  . Smoking status: Never Smoker  . Smokeless tobacco: Never Used  Substance Use Topics  . Alcohol use: No  . Drug use: No    No Known Allergies  Current Outpatient Medications  Medication Sig Dispense Refill  . aspirin EC 81 MG tablet Take by mouth.    Marland Kitchen CINNAMON PO Take 1 tablet by mouth daily.    . ergocalciferol (VITAMIN D2) 50000 units capsule Take 50,000 Units by mouth once a week.    . gabapentin (NEURONTIN) 100 MG capsule Take 50 mg by mouth 2 (two) times daily.    Marland Kitchen GARLIC PO Take 1 tablet by mouth daily.    Marland Kitchen glipiZIDE  (GLUCOTROL) 10 MG tablet Take 10 mg by mouth 2 (two) times daily.  5  . insulin glargine (LANTUS) 100 UNIT/ML injection Inject 20 Units into the skin at bedtime.    . metFORMIN (GLUCOPHAGE) 1000 MG tablet Take 1,000 mg by mouth 2 (two) times daily.  5  . Omega-3 Fatty Acids (FISH OIL PO) Take 1 capsule by mouth 2 (two) times daily.     Marland Kitchen VICTOZA 18 MG/3ML SOPN INJECT 0.3 MLS (1.8 MG TOTAL) SUBCUTANEOUSLY ONCE DAILY.  5   No current facility-administered medications for this visit.     Review of Systems Review of Systems  Constitutional: Negative.   Respiratory: Negative.   Cardiovascular: Negative.     Blood pressure 138/88, pulse 78, resp. rate 14, height 5' 5"  (1.651 m), weight 176 lb (79.8 kg).  Physical Exam Physical Exam  Constitutional: She is oriented to person, place, and time. She appears well-developed and well-nourished.  Eyes: Conjunctivae are normal. No scleral icterus.  Neck: Neck supple.  Cardiovascular: Normal rate, regular rhythm and normal heart sounds.  Pulmonary/Chest: Effort normal and breath sounds normal. Right breast exhibits no inverted nipple, no mass, no nipple discharge, no skin change and no tenderness. Left breast exhibits no inverted nipple, no mass, no nipple discharge, no skin  change and no tenderness.  Lymphadenopathy:    She has no cervical adenopathy.    She has no axillary adenopathy.  Neurological: She is alert and oriented to person, place, and time.  Skin: Skin is warm and dry.  Psychiatric: She has a normal mood and affect.    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 Family history of breast cancer.  Her daughter is a patient in this practice and had genetic test done showing she had a Bard 1 abnormality for which its significance is uncertain at this time    Plan    Follow up in June 2019 with bilateral screening mammogram and office visit with Dr  Bary Castilla. Discussed anti hormone therapy, patient still not comfortable at this time to begin therapy.     HPI, Physical Exam, Assessment and Plan have been scribed under the direction and in the presence of Mckinley Jewel, MD  Concepcion Living, LPN  I have completed the exam and reviewed the above documentation for accuracy and completeness.  I agree with the above.  Haematologist has been used and any errors in dictation or transcription are unintentional.  Seeplaputhur G. Jamal Collin, M.D., F.A.C.S.   Junie Panning G 11/28/2017, 12:51 PM

## 2017-11-28 NOTE — Patient Instructions (Addendum)
Follow up in June 2019 with bilateral screening mammogram and office visit with Dr Bary Castilla.

## 2017-12-13 HISTORY — PX: BACK SURGERY: SHX140

## 2017-12-29 DIAGNOSIS — Z01818 Encounter for other preprocedural examination: Secondary | ICD-10-CM | POA: Diagnosis not present

## 2017-12-29 DIAGNOSIS — M4326 Fusion of spine, lumbar region: Secondary | ICD-10-CM | POA: Diagnosis not present

## 2017-12-29 DIAGNOSIS — M4316 Spondylolisthesis, lumbar region: Secondary | ICD-10-CM | POA: Diagnosis not present

## 2018-01-02 DIAGNOSIS — M4316 Spondylolisthesis, lumbar region: Secondary | ICD-10-CM | POA: Diagnosis not present

## 2018-01-02 DIAGNOSIS — E1140 Type 2 diabetes mellitus with diabetic neuropathy, unspecified: Secondary | ICD-10-CM | POA: Diagnosis not present

## 2018-01-02 DIAGNOSIS — Z833 Family history of diabetes mellitus: Secondary | ICD-10-CM | POA: Diagnosis not present

## 2018-01-02 DIAGNOSIS — R000 Tachycardia, unspecified: Secondary | ICD-10-CM | POA: Diagnosis not present

## 2018-01-02 DIAGNOSIS — Z794 Long term (current) use of insulin: Secondary | ICD-10-CM | POA: Diagnosis not present

## 2018-01-02 DIAGNOSIS — M4807 Spinal stenosis, lumbosacral region: Secondary | ICD-10-CM | POA: Diagnosis not present

## 2018-01-02 DIAGNOSIS — R509 Fever, unspecified: Secondary | ICD-10-CM | POA: Diagnosis not present

## 2018-01-02 DIAGNOSIS — M48062 Spinal stenosis, lumbar region with neurogenic claudication: Secondary | ICD-10-CM | POA: Diagnosis not present

## 2018-01-02 DIAGNOSIS — M4716 Other spondylosis with myelopathy, lumbar region: Secondary | ICD-10-CM | POA: Diagnosis not present

## 2018-01-02 DIAGNOSIS — Z79899 Other long term (current) drug therapy: Secondary | ICD-10-CM | POA: Diagnosis not present

## 2018-01-02 DIAGNOSIS — E119 Type 2 diabetes mellitus without complications: Secondary | ICD-10-CM | POA: Diagnosis not present

## 2018-01-02 DIAGNOSIS — M4326 Fusion of spine, lumbar region: Secondary | ICD-10-CM | POA: Diagnosis not present

## 2018-01-02 DIAGNOSIS — M1990 Unspecified osteoarthritis, unspecified site: Secondary | ICD-10-CM | POA: Diagnosis not present

## 2018-01-02 DIAGNOSIS — Z7982 Long term (current) use of aspirin: Secondary | ICD-10-CM | POA: Diagnosis not present

## 2018-01-02 DIAGNOSIS — E876 Hypokalemia: Secondary | ICD-10-CM | POA: Diagnosis not present

## 2018-01-02 DIAGNOSIS — E785 Hyperlipidemia, unspecified: Secondary | ICD-10-CM | POA: Diagnosis not present

## 2018-01-02 DIAGNOSIS — M47816 Spondylosis without myelopathy or radiculopathy, lumbar region: Secondary | ICD-10-CM | POA: Diagnosis not present

## 2018-01-02 DIAGNOSIS — Z8249 Family history of ischemic heart disease and other diseases of the circulatory system: Secondary | ICD-10-CM | POA: Diagnosis not present

## 2018-01-06 DIAGNOSIS — E119 Type 2 diabetes mellitus without complications: Secondary | ICD-10-CM | POA: Diagnosis not present

## 2018-01-06 DIAGNOSIS — Z7982 Long term (current) use of aspirin: Secondary | ICD-10-CM | POA: Diagnosis not present

## 2018-01-06 DIAGNOSIS — Z794 Long term (current) use of insulin: Secondary | ICD-10-CM | POA: Diagnosis not present

## 2018-01-06 DIAGNOSIS — Z4789 Encounter for other orthopedic aftercare: Secondary | ICD-10-CM | POA: Diagnosis not present

## 2018-01-06 DIAGNOSIS — M4316 Spondylolisthesis, lumbar region: Secondary | ICD-10-CM | POA: Diagnosis not present

## 2018-01-06 DIAGNOSIS — Z981 Arthrodesis status: Secondary | ICD-10-CM | POA: Diagnosis not present

## 2018-01-10 DIAGNOSIS — M4316 Spondylolisthesis, lumbar region: Secondary | ICD-10-CM | POA: Diagnosis not present

## 2018-01-10 DIAGNOSIS — M47816 Spondylosis without myelopathy or radiculopathy, lumbar region: Secondary | ICD-10-CM | POA: Insufficient documentation

## 2018-01-10 DIAGNOSIS — Z981 Arthrodesis status: Secondary | ICD-10-CM | POA: Diagnosis not present

## 2018-01-12 ENCOUNTER — Other Ambulatory Visit: Payer: Self-pay

## 2018-01-12 NOTE — Patient Outreach (Signed)
[facility]) Care Management  01/12/2018  Dorothy Murray May 23, 1951 [id number]     Transition of Care Referral  Referral Date: 01/12/18 Referral Source: HTA Discharge Report Date of Admission: unknown Diagnosis: unknown Date of Discharge: 01/05/18 Facility: [facility]  Insurance: HTA   Outreach attempt # 1 to patient. No answer. RN CM left HIPAA compliant voicemail message along with contact info.   Plan: RN CM will make outreach attempt to patient within one business day if no return call.    Enzo Montgomery, RN,BSN,CCM [place] [facility] Telephonic Care Management Coordinator Direct Phone: [phone] Toll Free: [phone] Fax: [phone]

## 2018-01-13 ENCOUNTER — Other Ambulatory Visit: Payer: Self-pay

## 2018-01-13 NOTE — Patient Outreach (Signed)
[facility]) Care Management  01/13/2018  Dorothy Murray 1951/04/06 [id number]   Transition of Care Referral  Referral Date: 01/12/18 Referral Source: HTA Discharge Report Date of Admission: unknown Diagnosis: unknown Date of Discharge: 01/05/18 Facility: [facility]  Insurance: HTA    Incoming call from patient returning RN CM call. She voices she is doing fairly well since discharge from the hospital a week ago. She has already had follow up appt with surgeon. She voices that her stitches were removed and she was told incision healing and looking good. RN CM reviewed with patient s/s of infection and when to seek medical attention. She voiced understanding. She voices that she goes back to see surgeon on 02/07/18 to see if she can remove her back brace. Patient voices pain is managed and controlled at present. She has very supportive spouse who is assisting her as needed and taking her to appts. She reports that she has all her meds. She denies any questions or concerns regarding them. Patient appreciative of follow up call but declines having any [facility] needs or concerns at this time.     Plan: RN CM will notify [facility] administrative assistant of case status.    Enzo Montgomery, RN,BSN,CCM [place] [facility] Telephonic Care Management Coordinator Direct Phone: [phone] Toll Free: [phone] Fax: [phone]

## 2018-02-03 DIAGNOSIS — K5080 Crohn's disease of both small and large intestine without complications: Secondary | ICD-10-CM | POA: Diagnosis not present

## 2018-02-03 DIAGNOSIS — M5416 Radiculopathy, lumbar region: Secondary | ICD-10-CM | POA: Diagnosis not present

## 2018-02-03 DIAGNOSIS — E119 Type 2 diabetes mellitus without complications: Secondary | ICD-10-CM | POA: Diagnosis not present

## 2018-02-03 DIAGNOSIS — M48061 Spinal stenosis, lumbar region without neurogenic claudication: Secondary | ICD-10-CM | POA: Diagnosis not present

## 2018-02-10 DIAGNOSIS — Z0000 Encounter for general adult medical examination without abnormal findings: Secondary | ICD-10-CM | POA: Diagnosis not present

## 2018-02-10 DIAGNOSIS — K5080 Crohn's disease of both small and large intestine without complications: Secondary | ICD-10-CM | POA: Diagnosis not present

## 2018-02-10 DIAGNOSIS — F418 Other specified anxiety disorders: Secondary | ICD-10-CM | POA: Diagnosis not present

## 2018-02-10 DIAGNOSIS — E119 Type 2 diabetes mellitus without complications: Secondary | ICD-10-CM | POA: Diagnosis not present

## 2018-02-10 DIAGNOSIS — Z1211 Encounter for screening for malignant neoplasm of colon: Secondary | ICD-10-CM | POA: Diagnosis not present

## 2018-03-23 ENCOUNTER — Other Ambulatory Visit: Payer: Self-pay

## 2018-03-23 DIAGNOSIS — Z1231 Encounter for screening mammogram for malignant neoplasm of breast: Secondary | ICD-10-CM

## 2018-05-05 DIAGNOSIS — K50813 Crohn's disease of both small and large intestine with fistula: Secondary | ICD-10-CM | POA: Diagnosis not present

## 2018-05-05 DIAGNOSIS — Z800 Family history of malignant neoplasm of digestive organs: Secondary | ICD-10-CM | POA: Diagnosis not present

## 2018-05-05 DIAGNOSIS — Z86010 Personal history of colonic polyps: Secondary | ICD-10-CM | POA: Diagnosis not present

## 2018-05-05 DIAGNOSIS — E119 Type 2 diabetes mellitus without complications: Secondary | ICD-10-CM | POA: Diagnosis not present

## 2018-05-22 ENCOUNTER — Ambulatory Visit
Admission: RE | Admit: 2018-05-22 | Discharge: 2018-05-22 | Disposition: A | Discharge status: Home / Self Care | Payer: PPO | Source: Ambulatory Visit | Attending: General Surgery | Admitting: General Surgery

## 2018-05-22 DIAGNOSIS — Z1231 Encounter for screening mammogram for malignant neoplasm of breast: Secondary | ICD-10-CM | POA: Diagnosis not present

## 2018-05-30 ENCOUNTER — Ambulatory Visit (INDEPENDENT_AMBULATORY_CARE_PROVIDER_SITE_OTHER): Payer: PPO | Admitting: General Surgery

## 2018-05-30 ENCOUNTER — Encounter: Payer: Self-pay | Admitting: General Surgery

## 2018-05-30 ENCOUNTER — Ambulatory Visit: Payer: PPO | Admitting: General Surgery

## 2018-05-30 VITALS — BP 128/62 | HR 88 | Resp 12 | Ht 65.0 in | Wt 176.0 lb

## 2018-05-30 DIAGNOSIS — N6091 Unspecified benign mammary dysplasia of right breast: Secondary | ICD-10-CM | POA: Diagnosis not present

## 2018-05-30 NOTE — Patient Instructions (Addendum)
The patient is aware to call back for any questions or concerns. Patient will be asked to return to the office in one year with a bilateral screening mammogram. 

## 2018-05-30 NOTE — Progress Notes (Signed)
Patient ID: Dorothy Murray, female   DOB: 1951-10-26, 67 y.o.   MRN: [id number]  Chief Complaint  Patient presents with  . Follow-up    HPI Dorothy Murray is a 67 y.o. female who presents for her follow up breast evaluation. She has a history of ADH right breast with lumpectomy in 2017.The most recent mammogram was done on 05-22-18. Former patient of Dr Jamal Collin. Patient does perform regular self breast checks and gets regular mammograms done.   No new breast issues.  HPI  Past Medical History:  Diagnosis Date  . Crohn's disease ([place])   . 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 Diffuse cystic mastopathy   . Dysrhythmia     Past Surgical History:  Procedure Laterality Date  . BREAST BIOPSY Left 04/19/2016   stereo w/ Dr. Bary Castilla, benign  . BREAST EXCISIONAL BIOPSY Right 04/19/2016   w/Dr. Bary Castilla, benign   . BREAST LUMPECTOMY Right 05/03/2016   ATYPICAL DUCTAL HYPERPLASIA (ADH) WITH MICROCALCIFICATIONS/: BREAST LUMPECTOMY; Ellwood Sayers, MD;  Location: [facility] ORS;  Service: General;  Laterality: Right;  . BREAST LUMPECTOMY WITH NEEDLE LOCALIZATION Right 05/03/2016   Procedure: BREAST LUMPECTOMY WITH NEEDLE LOCALIZATION;  Surgeon: Christene Lye, MD;  Location: [facility] ORS;  Service: General;  Laterality: Right;  . CHOLECYSTECTOMY    . COLONOSCOPY  2010, 2015   [facility]  . COLONOSCOPY  12/2013    Family History  Problem Relation Age of Onset  . Breast cancer Sister   . Breast cancer Daughter 86       Dorothy Murray  . Heart attack Mother   . Heart attack Father   . Stomach cancer Brother   . Stomach cancer Sister   . Colon cancer Son 37    Social History Social History   Tobacco Use  . Smoking status: Never Smoker  . Smokeless tobacco: Never Used  Substance Use Topics  . Alcohol use: No  . Drug use: No    No Known Allergies  Current Outpatient Medications  Medication Sig Dispense Refill  . aspirin EC 81 MG tablet Take by mouth.    Marland Kitchen CINNAMON PO Take 1  tablet by mouth daily.    . ergocalciferol (VITAMIN D2) 50000 units capsule Take 50,000 Units by mouth once a week.    Marland Kitchen GARLIC PO Take 1 tablet by mouth daily.    Marland Kitchen glipiZIDE (GLUCOTROL) 10 MG tablet Take 10 mg by mouth 2 (two) times daily.  5  . insulin glargine (LANTUS) 100 UNIT/ML injection Inject 20 Units into the skin at bedtime.    . metFORMIN (GLUCOPHAGE) 1000 MG tablet Take 1,000 mg by mouth 2 (two) times daily.  5  . VICTOZA 18 MG/3ML SOPN INJECT 0.3 MLS (1.8 MG TOTAL) SUBCUTANEOUSLY ONCE DAILY.  5   No current facility-administered medications for this visit.     Review of Systems Review of Systems  Blood pressure 128/62, pulse 88, resp. rate 12, height 5' 5"  (1.651 m), weight 176 lb (79.8 kg), SpO2 97 %.  Physical Exam Physical Exam  Constitutional: She is oriented to person, place, and time. She appears well-developed and well-nourished.  HENT:  Mouth/Throat: Oropharynx is clear and moist.  Eyes: Conjunctivae are normal. No scleral icterus.  Neck: Neck supple.  Cardiovascular: Normal rate, regular rhythm and normal heart sounds.  Pulmonary/Chest: Effort normal and breath sounds normal. Right breast exhibits no inverted nipple, no mass, no nipple discharge, no skin change and no tenderness. Left breast exhibits  no inverted nipple, no mass, no nipple discharge, no skin change and no tenderness.    Lymphadenopathy:    She has no cervical adenopathy.    She has no axillary adenopathy.  Neurological: She is alert and oriented to person, place, and time.  Skin: Skin is warm and dry.  Psychiatric: Her behavior is normal.    Data Reviewed May 03, 2016 biopsy: DIAGNOSIS:  A. RIGHT BREAST, UPPER OUTER QUADRANT; NEEDLE-LOCALIZED EXCISION:  - ATYPICAL DUCTAL HYPERPLASIA (ADH) WITH MICROCALCIFICATIONS.  - BIOPSY SITE CHANGES, MARKER CLIP PRESENT.  - THE MARGINS OF EXCISION ARE NEGATIVE, CLOSEST MARGIN 5 MM (MEDIAL).   Bilateral screening mammograms dated May 22, 2018  reviewed.  No interval change.  BI-RADS-1.  Assessment    Good tolerance of antiestrogen therapy.    No evidence of recurrent ADH.      Plan    Patient will be asked to return to the office in one year with a bilateral screening mammogram.      HPI, Physical Exam, Assessment and Plan have been scribed under the direction and in the presence of Robert Bellow, MD. Karie Fetch, RN  I have completed the exam and reviewed the above documentation for accuracy and completeness.  I agree with the above.  Haematologist has been used and any errors in dictation or transcription are unintentional.  Hervey Ard, M.D., F.A.C.S.   [name] 05/30/2018, 6:51 PM

## 2018-06-16 DIAGNOSIS — M4316 Spondylolisthesis, lumbar region: Secondary | ICD-10-CM | POA: Diagnosis not present

## 2018-06-16 DIAGNOSIS — Z6829 Body mass index (BMI) 29.0-29.9, adult: Secondary | ICD-10-CM | POA: Diagnosis not present

## 2018-06-16 DIAGNOSIS — M48062 Spinal stenosis, lumbar region with neurogenic claudication: Secondary | ICD-10-CM | POA: Diagnosis not present

## 2018-06-16 DIAGNOSIS — M47816 Spondylosis without myelopathy or radiculopathy, lumbar region: Secondary | ICD-10-CM | POA: Diagnosis not present

## 2018-06-23 DIAGNOSIS — Z1211 Encounter for screening for malignant neoplasm of colon: Secondary | ICD-10-CM | POA: Diagnosis not present

## 2018-06-23 DIAGNOSIS — F418 Other specified anxiety disorders: Secondary | ICD-10-CM | POA: Diagnosis not present

## 2018-06-23 DIAGNOSIS — K5080 Crohn's disease of both small and large intestine without complications: Secondary | ICD-10-CM | POA: Diagnosis not present

## 2018-06-23 DIAGNOSIS — E119 Type 2 diabetes mellitus without complications: Secondary | ICD-10-CM | POA: Diagnosis not present

## 2018-06-23 DIAGNOSIS — Z0000 Encounter for general adult medical examination without abnormal findings: Secondary | ICD-10-CM | POA: Diagnosis not present

## 2018-06-30 DIAGNOSIS — K50813 Crohn's disease of both small and large intestine with fistula: Secondary | ICD-10-CM | POA: Diagnosis not present

## 2018-06-30 DIAGNOSIS — E119 Type 2 diabetes mellitus without complications: Secondary | ICD-10-CM | POA: Diagnosis not present

## 2018-07-14 ENCOUNTER — Encounter: Payer: Self-pay | Admitting: *Deleted

## 2018-07-17 ENCOUNTER — Encounter
Admission: RE | Disposition: A | Discharge status: Home / Self Care | Payer: Self-pay | Source: Ambulatory Visit | Attending: Unknown Physician Specialty

## 2018-07-17 ENCOUNTER — Ambulatory Visit: Payer: PPO | Admitting: Anesthesiology

## 2018-07-17 ENCOUNTER — Ambulatory Visit
Admission: RE | Admit: 2018-07-17 | Discharge: 2018-07-17 | Disposition: A | Discharge status: Home / Self Care | Payer: PPO | Source: Ambulatory Visit | Attending: Unknown Physician Specialty | Admitting: Unknown Physician Specialty

## 2018-07-17 DIAGNOSIS — K635 Polyp of colon: Secondary | ICD-10-CM | POA: Diagnosis not present

## 2018-07-17 DIAGNOSIS — Z79899 Other long term (current) drug therapy: Secondary | ICD-10-CM | POA: Insufficient documentation

## 2018-07-17 DIAGNOSIS — Z8719 Personal history of other diseases of the digestive system: Secondary | ICD-10-CM | POA: Diagnosis not present

## 2018-07-17 DIAGNOSIS — Z7982 Long term (current) use of aspirin: Secondary | ICD-10-CM | POA: Diagnosis not present

## 2018-07-17 DIAGNOSIS — Z86010 Personal history of colonic polyps: Secondary | ICD-10-CM | POA: Diagnosis not present

## 2018-07-17 DIAGNOSIS — K640 First degree hemorrhoids: Secondary | ICD-10-CM | POA: Insufficient documentation

## 2018-07-17 DIAGNOSIS — Z794 Long term (current) use of insulin: Secondary | ICD-10-CM | POA: Insufficient documentation

## 2018-07-17 DIAGNOSIS — D120 Benign neoplasm of cecum: Secondary | ICD-10-CM | POA: Diagnosis not present

## 2018-07-17 DIAGNOSIS — E119 Type 2 diabetes mellitus without complications: Secondary | ICD-10-CM | POA: Insufficient documentation

## 2018-07-17 DIAGNOSIS — K621 Rectal polyp: Secondary | ICD-10-CM | POA: Insufficient documentation

## 2018-07-17 DIAGNOSIS — K604 Rectal fistula: Secondary | ICD-10-CM | POA: Diagnosis not present

## 2018-07-17 DIAGNOSIS — D123 Benign neoplasm of transverse colon: Secondary | ICD-10-CM | POA: Diagnosis not present

## 2018-07-17 DIAGNOSIS — Z8249 Family history of ischemic heart disease and other diseases of the circulatory system: Secondary | ICD-10-CM | POA: Insufficient documentation

## 2018-07-17 DIAGNOSIS — Z1211 Encounter for screening for malignant neoplasm of colon: Secondary | ICD-10-CM | POA: Insufficient documentation

## 2018-07-17 DIAGNOSIS — K649 Unspecified hemorrhoids: Secondary | ICD-10-CM | POA: Diagnosis not present

## 2018-07-17 DIAGNOSIS — K5090 Crohn's disease, unspecified, without complications: Secondary | ICD-10-CM | POA: Insufficient documentation

## 2018-07-17 HISTORY — PX: COLONOSCOPY WITH PROPOFOL: SHX5780

## 2018-07-17 LAB — GLUCOSE, CAPILLARY: Glucose-Capillary: 194 mg/dL — ABNORMAL HIGH (ref 70–99)

## 2018-07-17 SURGERY — COLONOSCOPY WITH PROPOFOL
Anesthesia: General

## 2018-07-17 MED ORDER — FENTANYL CITRATE (PF) 100 MCG/2ML IJ SOLN
INTRAMUSCULAR | Status: AC
  Filled 2018-07-17: qty 2

## 2018-07-17 MED ORDER — EPHEDRINE SULFATE 50 MG/ML IJ SOLN
INTRAMUSCULAR | Status: DC | PRN
  Administered 2018-07-17 (×2): 5 mg via INTRAVENOUS

## 2018-07-17 MED ORDER — MIDAZOLAM HCL 5 MG/5ML IJ SOLN
INTRAMUSCULAR | Status: DC | PRN
  Administered 2018-07-17: 2 mg via INTRAVENOUS

## 2018-07-17 MED ORDER — FENTANYL CITRATE (PF) 100 MCG/2ML IJ SOLN
INTRAMUSCULAR | Status: DC | PRN
  Administered 2018-07-17 (×2): 50 ug via INTRAVENOUS

## 2018-07-17 MED ORDER — MIDAZOLAM HCL 2 MG/2ML IJ SOLN
INTRAMUSCULAR | Status: AC
  Filled 2018-07-17: qty 2

## 2018-07-17 MED ORDER — PROPOFOL 500 MG/50ML IV EMUL
INTRAVENOUS | Status: AC
  Filled 2018-07-17: qty 50

## 2018-07-17 MED ORDER — SODIUM CHLORIDE 0.9 % IV SOLN
INTRAVENOUS | Status: DC
  Administered 2018-07-17: 1000 mL via INTRAVENOUS

## 2018-07-17 MED ORDER — PROPOFOL 500 MG/50ML IV EMUL
INTRAVENOUS | Status: DC | PRN
  Administered 2018-07-17: 70 ug/kg/min via INTRAVENOUS

## 2018-07-17 MED ORDER — LIDOCAINE HCL (PF) 2 % IJ SOLN
INTRAMUSCULAR | Status: DC | PRN
  Administered 2018-07-17: 60 mg

## 2018-07-17 MED ORDER — LIDOCAINE HCL (PF) 2 % IJ SOLN
INTRAMUSCULAR | Status: AC
  Filled 2018-07-17: qty 10

## 2018-07-17 MED ORDER — PROPOFOL 10 MG/ML IV BOLUS
INTRAVENOUS | Status: DC | PRN
  Administered 2018-07-17: 30 mg via INTRAVENOUS
  Administered 2018-07-17: 20 mg via INTRAVENOUS

## 2018-07-17 MED ORDER — SODIUM CHLORIDE 0.9 % IV SOLN: INTRAVENOUS | Status: DC

## 2018-07-17 NOTE — H&P (Signed)
Primary Care Physician:  Tracie Harrier, MD Primary Gastroenterologist:  Dr. Vira Agar  Pre-Procedure History & Physical: HPI:  Dorothy Murray is a 67 y.o. female is here for an colonoscopy.  FH colon cancer in her son.  She had Crohn's disease in the past.   Past Medical History:  Diagnosis Date  . Crohn's disease ([place])   . 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 Diffuse cystic mastopathy   . Dysrhythmia     Past Surgical History:  Procedure Laterality Date  . BREAST BIOPSY Left 04/19/2016   stereo w/ Dr. Bary Castilla, benign  . BREAST EXCISIONAL BIOPSY Right 04/19/2016   w/Dr. Bary Castilla, benign   . BREAST LUMPECTOMY Right 05/03/2016   ATYPICAL DUCTAL HYPERPLASIA (ADH) WITH MICROCALCIFICATIONS/: BREAST LUMPECTOMY; Ellwood Sayers, MD;  Location: [facility] ORS;  Service: General;  Laterality: Right;  . BREAST LUMPECTOMY WITH NEEDLE LOCALIZATION Right 05/03/2016   Procedure: BREAST LUMPECTOMY WITH NEEDLE LOCALIZATION;  Surgeon: Christene Lye, MD;  Location: [facility] ORS;  Service: General;  Laterality: Right;  . CHOLECYSTECTOMY    . COLONOSCOPY  2010, 2015   [facility]  . COLONOSCOPY  12/2013    Prior to Admission medications   Medication Sig Start Date End Date Taking? Authorizing Provider  aspirin EC 81 MG tablet Take by mouth.   Yes [provider]  CINNAMON PO Take 1 tablet by mouth daily.   Yes [provider]  ergocalciferol (VITAMIN D2) 50000 units capsule Take 50,000 Units by mouth once a week.   Yes [provider]  GARLIC PO Take 1 tablet by mouth daily.   Yes [provider]  glipiZIDE (GLUCOTROL) 10 MG tablet Take 10 mg by mouth 2 (two) times daily. 11/06/17  Yes [provider]  insulin glargine (LANTUS) 100 UNIT/ML injection Inject 20 Units into the skin at bedtime.   Yes [provider]  metFORMIN (GLUCOPHAGE) 1000 MG tablet Take 1,000 mg by mouth 2 (two) times daily. 03/13/16  Yes [provider]   VICTOZA 18 MG/3ML SOPN INJECT 0.3 MLS (1.8 MG TOTAL) SUBCUTANEOUSLY ONCE DAILY. 03/13/16  Yes [provider]    Allergies as of 05/09/2018  . (No Known Allergies)    Family History  Problem Relation Age of Onset  . Breast cancer Sister   . Breast cancer Daughter 41       [name]  . Heart attack Mother   . Heart attack Father   . Stomach cancer Brother   . Stomach cancer Sister   . Colon cancer Son 67    Social History   Socioeconomic History  . Marital status: Married    Spouse name: Not on file  . Number of children: Not on file  . Years of education: Not on file  . Highest education level: Not on file  Occupational History  . Not on file  Social Needs  . Financial resource strain: Not on file  . Food insecurity:    Worry: Not on file    Inability: Not on file  . Transportation needs:    Medical: Not on file    Non-medical: Not on file  Tobacco Use  . Smoking status: Never Smoker  . Smokeless tobacco: Never Used  Substance and Sexual Activity  . Alcohol use: No  . Drug use: No  . Sexual activity: Not on file  Lifestyle  . Physical activity:    Days per week: Not on file    Minutes per session:  Not on file  . Stress: Not on file  Relationships  . Social connections:    Talks on phone: Not on file    Gets together: Not on file    Attends religious service: Not on file    Active member of club or organization: Not on file    Attends meetings of clubs or organizations: Not on file    Relationship status: Not on file  . Intimate partner violence:    Fear of current or ex partner: Not on file    Emotionally abused: Not on file    Physically abused: Not on file    Forced sexual activity: Not on file  Other Topics Concern  . Not on file  Social History Narrative  . Not on file    Review of Systems: See HPI, otherwise negative ROS  Physical Exam: BP 139/81   Pulse 76   Temp (!) 96.5 F (35.8 C) (Tympanic)   Resp 20   Ht 5' 5.5" (1.664  m)   Wt 78.5 kg (173 lb)   SpO2 (!) 10%   BMI 28.35 kg/m  General:   Alert,  pleasant and cooperative in NAD Head:  Normocephalic and atraumatic. Neck:  Supple; no masses or thyromegaly. Lungs:  Clear throughout to auscultation.    Heart:  Regular rate and rhythm. Abdomen:  Soft, nontender and nondistended. Normal bowel sounds, without guarding, and without rebound.   Neurologic:  Alert and  oriented x4;  grossly normal neurologically.  Impression/Plan: Dorothy Murray is here for an colonoscopy to be performed for follow up Crohn's disease and FH colon cancer in son.  Risks, benefits, limitations, and alternatives regarding  colonoscopy have been reviewed with the patient.  Questions have been answered.  All parties agreeable.   Gaylyn Cheers, MD  07/17/2018, 8:45 AM

## 2018-07-17 NOTE — Op Note (Signed)
[facility] Patient Name: Dorothy Murray Procedure Date: 07/17/2018 8:47 AM MRN: [id number] Account #: [id number] Date of Birth: 15-Aug-1951 Admit Type: Outpatient Age: 67 Room: [facility] ENDO ROOM 3 Gender: Female Note Status: Finalized Procedure:            Colonoscopy Indications:          High risk colon cancer surveillance: Crohn's disease Providers:            Manya Silvas, MD Referring MD:         Tracie Harrier, MD (Referring MD) Medicines:            Propofol per Anesthesia Complications:        No immediate complications. Procedure:            Pre-Anesthesia Assessment:                       - After reviewing the risks and benefits, the patient                        was deemed in satisfactory condition to undergo the                        procedure.                       After obtaining informed consent, the colonoscope was                        passed under direct vision. Throughout the procedure,                        the patient's blood pressure, pulse, and oxygen                        saturations were monitored continuously. The                        Colonoscope was introduced through the anus and                        advanced to the the cecum, identified by appendiceal                        orifice and ileocecal valve. The colonoscopy was                        performed without difficulty. The patient tolerated the                        procedure well. The quality of the bowel preparation                        was excellent. Findings:      A polyp likie mass seen at ileocecal valve and biopsied twice.      A diminutive polyp was found in the cecum. The polyp was sessile. The       polyp was removed with a hot snare. Resection and retrieval were       complete.      One diminutive nodule was found in the cecum. Biopsies were taken with a  cold forceps for histology.      A diminutive polyp was found in the  transverse colon. The polyp was       sessile. The polyp was removed with a hot snare. Resection and retrieval       were complete.      A diminutive polyp was found in the recto-sigmoid colon. The polyp was       sessile. The polyp was removed with a jumbo cold forceps. Resection and       retrieval were complete.      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      Internal hemorrhoids were found during endoscopy. The hemorrhoids were       small and Grade I (internal hemorrhoids that do not prolapse).      The exam was otherwise without abnormality. No sign of active Crohn's       disease seen. Impression:           - One diminutive polyp in the cecum, removed with a hot                        snare. Resected and retrieved.                       - Nodule in the cecum. Biopsied.                       - One diminutive polyp in the transverse colon, removed                        with a hot snare. Resected and retrieved.                       - One diminutive polyp at the recto-sigmoid colon,                        removed with a jumbo cold forceps. Resected and                        retrieved.                       - Internal hemorrhoids.                       - The examination was otherwise normal. Recommendation:       - Await pathology results. Manya Silvas, MD 07/17/2018 9:26:12 AM This report has been signed electronically. Number of Addenda: 0 Note Initiated On: 07/17/2018 8:47 AM Scope Withdrawal Time: 0 hours 15 minutes 44 seconds  Total Procedure Duration: 0 hours 25 minutes 6 seconds       [facility]

## 2018-07-17 NOTE — Transfer of Care (Signed)
Immediate Anesthesia Transfer of Care Note  Patient: Dorothy Murray  Procedure(s) Performed: COLONOSCOPY WITH PROPOFOL (N/A )  Patient Location: PACU  Anesthesia Type:General  Level of Consciousness: sedated  Airway & Oxygen Therapy: Patient Spontanous Breathing and Patient connected to nasal cannula oxygen  Post-op Assessment: Report given to RN and Post -op Vital signs reviewed and stable  Post vital signs: Reviewed and stable  Last Vitals:  Vitals Value Taken Time  BP 117/70 07/17/2018  9:22 AM  Temp    Pulse 75 07/17/2018  9:22 AM  Resp 18 07/17/2018  9:22 AM  SpO2 99 % 07/17/2018  9:22 AM  Vitals shown include unvalidated device data.  Last Pain:  Vitals:   07/17/18 0804  TempSrc: Tympanic      Patients Stated Pain Goal: 0 (13/24/40 1027)  Complications: No apparent anesthesia complications

## 2018-07-17 NOTE — Anesthesia Postprocedure Evaluation (Signed)
Anesthesia Post Note  Patient: CACEY WILLOW  Procedure(s) Performed: COLONOSCOPY WITH PROPOFOL (N/A )  Patient location during evaluation: PACU Anesthesia Type: General Level of consciousness: awake and alert Pain management: pain level controlled Vital Signs Assessment: post-procedure vital signs reviewed and stable Respiratory status: spontaneous breathing, nonlabored ventilation, respiratory function stable and patient connected to nasal cannula oxygen Cardiovascular status: blood pressure returned to baseline and stable Postop Assessment: no apparent nausea or vomiting Anesthetic complications: no     Last Vitals:  Vitals:   07/17/18 0804 07/17/18 0922  BP: 139/81 117/70  Pulse: 76 77  Resp: 20 18  Temp: (!) 35.8 C (!) 35.9 C  SpO2: 100% 100%    Last Pain:  Vitals:   07/17/18 0951  TempSrc:   PainSc: 0-No pain                 Molli Barrows

## 2018-07-17 NOTE — Anesthesia Post-op Follow-up Note (Signed)
Anesthesia QCDR form completed.        

## 2018-07-17 NOTE — Anesthesia Preprocedure Evaluation (Signed)
Anesthesia Evaluation  Patient identified by MRN, date of birth, ID band Patient awake    Reviewed: Allergy & Precautions, H&P , NPO status , Patient's Chart, lab work & pertinent test results, reviewed documented beta blocker date and time   Airway Mallampati: II   Neck ROM: full    Dental  (+) Poor Dentition   Pulmonary neg pulmonary ROS,    Pulmonary exam normal        Cardiovascular Exercise Tolerance: Good negative cardio ROS Normal cardiovascular exam+ dysrhythmias  Rhythm:regular Rate:Normal     Neuro/Psych negative neurological ROS  negative psych ROS   GI/Hepatic negative GI ROS, Neg liver ROS,   Endo/Other  negative endocrine ROSdiabetes, Well Controlled, Type 2, Insulin Dependent  Renal/GU negative Renal ROS  negative genitourinary   Musculoskeletal   Abdominal   Peds  Hematology negative hematology ROS (+)   Anesthesia Other Findings Past Medical History: No date: Crohn's disease ([place]) No date: Diabetes mellitus without complication (HCC)     Comment:  NIDDM 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 Bary Castilla, benign 04/19/2016: BREAST EXCISIONAL BIOPSY; Right     Comment:  w/Dr. Bary Castilla, benign  05/03/2016: BREAST LUMPECTOMY; Right     Comment:  ATYPICAL DUCTAL HYPERPLASIA (ADH) WITH               MICROCALCIFICATIONS/: BREAST LUMPECTOMY; Ellwood Sayers, MD;                Location: [facility] ORS;  Service: General;  Laterality:               Right; 05/03/2016: BREAST LUMPECTOMY WITH NEEDLE LOCALIZATION; Right     Comment:  Procedure: BREAST LUMPECTOMY WITH NEEDLE LOCALIZATION;                Surgeon: Christene Lye, MD;  Location: [facility] ORS;               Service: General;  Laterality: Right; No date: CHOLECYSTECTOMY 2010, 2015: COLONOSCOPY     Comment:  [facility] 12/2013: COLONOSCOPY   Reproductive/Obstetrics negative OB ROS                             Anesthesia Physical Anesthesia Plan  ASA: III  Anesthesia Plan: General   Post-op Pain Management:    Induction:   PONV Risk Score and Plan:   Airway Management Planned:   Additional Equipment:   Intra-op Plan:   Post-operative Plan:   Informed Consent: I have reviewed the patients History and Physical, chart, labs and discussed the procedure including the risks, benefits and alternatives for the proposed anesthesia with the patient or authorized representative who has indicated his/her understanding and acceptance.   Dental Advisory Given  Plan Discussed with: CRNA  Anesthesia Plan Comments:         Anesthesia Quick Evaluation

## 2018-07-18 ENCOUNTER — Encounter: Payer: Self-pay | Admitting: Unknown Physician Specialty

## 2018-07-20 LAB — SURGICAL PATHOLOGY

## 2018-08-29 ENCOUNTER — Other Ambulatory Visit: Payer: Self-pay

## 2018-08-29 NOTE — Patient Outreach (Signed)
[name]) Care Management  08/29/2018  [name] 04/10/1951 [id number]   Telephone Screen  Referral Date: 08/29/18 Referral Source: [employer] Concierge Referral Reason: "member is in coverage gap and needs financial assistance" Insurance: HTA   Outreach attempt # 1 to patient. Spoke with patient. Discussed referral reason with patient. She voices that she went into the donut hole in July. Since then she has been having trouble affording her insulins(Victoza and Lantus). Patient denies being currently out of the meds. She has not checked with MD office regarding samples. RN CM advised and encouraged patient to do so. She reports she goes to see PCP in a few weeks and will inquire. She also plans to ask MD if she could switch to cheaper insulins. Patient voices that she has this issue every year as she gets in the donut hole yearly. RN CM discussed [facility] pharmacy assistance and possible drug assistance programs. Patient inquired if her household income or personal income would be looked at. She states that she knows she will not qualify for help as her spouse's income is too high. Offered pharmacy assistance to double check. Patient declined voicing "it won't do any good-I already know I don't qualify." She voices that she is monitoring cbgs and they remain controlled at present. Blood sugar today was 153. She denies needing any further education and/or support in managing blood sugars. She reports that she has supportive spouse in the home to assist her with any needs or concerns that may arise. She is independent with ADLs and most IADLs. Patient denies needing any [facility] services at this time but was appreciative of follow up call. Patient agreeable to RN CM mailing out contact info via mail for any future needs or concerns.      Plan: RN CM will close case at this time as patient declined assistance. RN CM will send [facility] info via mail to patient.    Enzo Montgomery,  RN,BSN,CCM [facility] Telephonic Care Management Coordinator Direct Phone: [phone] Toll Free: [phone] Fax: [phone]

## 2018-09-22 DIAGNOSIS — M4316 Spondylolisthesis, lumbar region: Secondary | ICD-10-CM | POA: Diagnosis not present

## 2018-09-22 DIAGNOSIS — Z6829 Body mass index (BMI) 29.0-29.9, adult: Secondary | ICD-10-CM | POA: Diagnosis not present

## 2018-09-22 DIAGNOSIS — M47816 Spondylosis without myelopathy or radiculopathy, lumbar region: Secondary | ICD-10-CM | POA: Diagnosis not present

## 2018-10-20 DIAGNOSIS — E119 Type 2 diabetes mellitus without complications: Secondary | ICD-10-CM | POA: Diagnosis not present

## 2018-10-20 DIAGNOSIS — K50813 Crohn's disease of both small and large intestine with fistula: Secondary | ICD-10-CM | POA: Diagnosis not present

## 2018-10-27 DIAGNOSIS — E1165 Type 2 diabetes mellitus with hyperglycemia: Secondary | ICD-10-CM | POA: Diagnosis not present

## 2018-10-27 DIAGNOSIS — E538 Deficiency of other specified B group vitamins: Secondary | ICD-10-CM | POA: Diagnosis not present

## 2018-10-27 DIAGNOSIS — Z0000 Encounter for general adult medical examination without abnormal findings: Secondary | ICD-10-CM | POA: Diagnosis not present

## 2018-10-27 DIAGNOSIS — E1169 Type 2 diabetes mellitus with other specified complication: Secondary | ICD-10-CM | POA: Diagnosis not present

## 2018-10-27 DIAGNOSIS — R5383 Other fatigue: Secondary | ICD-10-CM | POA: Diagnosis not present

## 2018-10-27 DIAGNOSIS — Z794 Long term (current) use of insulin: Secondary | ICD-10-CM | POA: Diagnosis not present

## 2018-10-27 DIAGNOSIS — R5381 Other malaise: Secondary | ICD-10-CM | POA: Diagnosis not present

## 2018-10-27 DIAGNOSIS — Z23 Encounter for immunization: Secondary | ICD-10-CM | POA: Diagnosis not present

## 2018-10-27 DIAGNOSIS — E669 Obesity, unspecified: Secondary | ICD-10-CM | POA: Diagnosis not present

## 2018-12-29 DIAGNOSIS — M47816 Spondylosis without myelopathy or radiculopathy, lumbar region: Secondary | ICD-10-CM | POA: Diagnosis not present

## 2018-12-29 DIAGNOSIS — Z6829 Body mass index (BMI) 29.0-29.9, adult: Secondary | ICD-10-CM | POA: Diagnosis not present

## 2018-12-29 DIAGNOSIS — M4316 Spondylolisthesis, lumbar region: Secondary | ICD-10-CM | POA: Diagnosis not present

## 2019-02-23 DIAGNOSIS — R5383 Other fatigue: Secondary | ICD-10-CM | POA: Diagnosis not present

## 2019-02-23 DIAGNOSIS — E1165 Type 2 diabetes mellitus with hyperglycemia: Secondary | ICD-10-CM | POA: Diagnosis not present

## 2019-02-23 DIAGNOSIS — E669 Obesity, unspecified: Secondary | ICD-10-CM | POA: Diagnosis not present

## 2019-02-23 DIAGNOSIS — R5381 Other malaise: Secondary | ICD-10-CM | POA: Diagnosis not present

## 2019-02-23 DIAGNOSIS — E1169 Type 2 diabetes mellitus with other specified complication: Secondary | ICD-10-CM | POA: Diagnosis not present

## 2019-02-23 DIAGNOSIS — Z794 Long term (current) use of insulin: Secondary | ICD-10-CM | POA: Diagnosis not present

## 2019-02-23 DIAGNOSIS — E538 Deficiency of other specified B group vitamins: Secondary | ICD-10-CM | POA: Diagnosis not present

## 2019-02-23 DIAGNOSIS — Z0000 Encounter for general adult medical examination without abnormal findings: Secondary | ICD-10-CM | POA: Diagnosis not present

## 2019-03-08 DIAGNOSIS — E1165 Type 2 diabetes mellitus with hyperglycemia: Secondary | ICD-10-CM | POA: Diagnosis not present

## 2019-03-08 DIAGNOSIS — M2061 Acquired deformities of toe(s), unspecified, right foot: Secondary | ICD-10-CM | POA: Diagnosis not present

## 2019-03-08 DIAGNOSIS — M4316 Spondylolisthesis, lumbar region: Secondary | ICD-10-CM | POA: Diagnosis not present

## 2019-03-08 DIAGNOSIS — E119 Type 2 diabetes mellitus without complications: Secondary | ICD-10-CM | POA: Diagnosis not present

## 2019-03-08 DIAGNOSIS — Z0000 Encounter for general adult medical examination without abnormal findings: Secondary | ICD-10-CM | POA: Diagnosis not present

## 2019-03-08 DIAGNOSIS — Z794 Long term (current) use of insulin: Secondary | ICD-10-CM | POA: Diagnosis not present

## 2019-03-19 DIAGNOSIS — I255 Ischemic cardiomyopathy: Secondary | ICD-10-CM | POA: Diagnosis not present

## 2019-03-19 DIAGNOSIS — M216X1 Other acquired deformities of right foot: Secondary | ICD-10-CM | POA: Diagnosis not present

## 2019-03-19 DIAGNOSIS — M2041 Other hammer toe(s) (acquired), right foot: Secondary | ICD-10-CM | POA: Diagnosis not present

## 2019-03-19 DIAGNOSIS — M205X1 Other deformities of toe(s) (acquired), right foot: Secondary | ICD-10-CM | POA: Diagnosis not present

## 2019-03-19 DIAGNOSIS — M2011 Hallux valgus (acquired), right foot: Secondary | ICD-10-CM | POA: Diagnosis not present

## 2019-03-19 DIAGNOSIS — M79671 Pain in right foot: Secondary | ICD-10-CM | POA: Diagnosis not present

## 2019-03-19 DIAGNOSIS — R0602 Shortness of breath: Secondary | ICD-10-CM | POA: Diagnosis not present

## 2019-03-19 DIAGNOSIS — L821 Other seborrheic keratosis: Secondary | ICD-10-CM | POA: Diagnosis not present

## 2019-04-09 DIAGNOSIS — M216X1 Other acquired deformities of right foot: Secondary | ICD-10-CM | POA: Diagnosis not present

## 2019-04-09 DIAGNOSIS — M205X1 Other deformities of toe(s) (acquired), right foot: Secondary | ICD-10-CM | POA: Diagnosis not present

## 2019-04-09 DIAGNOSIS — M21961 Unspecified acquired deformity of right lower leg: Secondary | ICD-10-CM | POA: Diagnosis not present

## 2019-04-09 DIAGNOSIS — M2041 Other hammer toe(s) (acquired), right foot: Secondary | ICD-10-CM | POA: Diagnosis not present

## 2019-04-09 DIAGNOSIS — M2011 Hallux valgus (acquired), right foot: Secondary | ICD-10-CM | POA: Diagnosis not present

## 2019-05-28 ENCOUNTER — Other Ambulatory Visit: Payer: Self-pay | Admitting: Podiatry

## 2019-05-28 DIAGNOSIS — M2011 Hallux valgus (acquired), right foot: Secondary | ICD-10-CM | POA: Diagnosis not present

## 2019-05-28 DIAGNOSIS — M216X1 Other acquired deformities of right foot: Secondary | ICD-10-CM | POA: Diagnosis not present

## 2019-05-28 DIAGNOSIS — M21961 Unspecified acquired deformity of right lower leg: Secondary | ICD-10-CM | POA: Diagnosis not present

## 2019-05-28 DIAGNOSIS — M2041 Other hammer toe(s) (acquired), right foot: Secondary | ICD-10-CM | POA: Diagnosis not present

## 2019-05-28 DIAGNOSIS — M205X1 Other deformities of toe(s) (acquired), right foot: Secondary | ICD-10-CM | POA: Diagnosis not present

## 2019-05-31 ENCOUNTER — Encounter: Payer: Self-pay | Admitting: *Deleted

## 2019-05-31 ENCOUNTER — Other Ambulatory Visit: Payer: Self-pay

## 2019-06-04 ENCOUNTER — Other Ambulatory Visit: Payer: Self-pay

## 2019-06-04 ENCOUNTER — Other Ambulatory Visit
Admission: RE | Admit: 2019-06-04 | Discharge: 2019-06-04 | Disposition: A | Discharge status: Home / Self Care | Payer: PPO | Source: Ambulatory Visit | Attending: Podiatry | Admitting: Podiatry

## 2019-06-04 DIAGNOSIS — Z1159 Encounter for screening for other viral diseases: Secondary | ICD-10-CM | POA: Insufficient documentation

## 2019-06-05 LAB — NOVEL CORONAVIRUS, NAA (HOSP ORDER, SEND-OUT TO REF LAB; TAT 18-24 HRS): SARS-CoV-2, NAA: NOT DETECTED

## 2019-06-07 ENCOUNTER — Encounter
Admission: RE | Disposition: A | Discharge status: Home / Self Care | Payer: Self-pay | Source: Home / Self Care | Attending: Podiatry

## 2019-06-07 ENCOUNTER — Ambulatory Visit: Payer: PPO | Admitting: Anesthesiology

## 2019-06-07 ENCOUNTER — Ambulatory Visit
Admission: RE | Admit: 2019-06-07 | Discharge: 2019-06-07 | Disposition: A | Discharge status: Home / Self Care | Payer: PPO | Attending: Podiatry | Admitting: Podiatry

## 2019-06-07 DIAGNOSIS — Z79899 Other long term (current) drug therapy: Secondary | ICD-10-CM | POA: Diagnosis not present

## 2019-06-07 DIAGNOSIS — M2041 Other hammer toe(s) (acquired), right foot: Secondary | ICD-10-CM | POA: Diagnosis not present

## 2019-06-07 DIAGNOSIS — Z794 Long term (current) use of insulin: Secondary | ICD-10-CM | POA: Diagnosis not present

## 2019-06-07 DIAGNOSIS — M216X1 Other acquired deformities of right foot: Secondary | ICD-10-CM | POA: Insufficient documentation

## 2019-06-07 DIAGNOSIS — M205X1 Other deformities of toe(s) (acquired), right foot: Secondary | ICD-10-CM | POA: Diagnosis not present

## 2019-06-07 DIAGNOSIS — Z7982 Long term (current) use of aspirin: Secondary | ICD-10-CM | POA: Insufficient documentation

## 2019-06-07 DIAGNOSIS — M2011 Hallux valgus (acquired), right foot: Secondary | ICD-10-CM | POA: Diagnosis not present

## 2019-06-07 DIAGNOSIS — Z6828 Body mass index (BMI) 28.0-28.9, adult: Secondary | ICD-10-CM | POA: Insufficient documentation

## 2019-06-07 DIAGNOSIS — E119 Type 2 diabetes mellitus without complications: Secondary | ICD-10-CM | POA: Diagnosis not present

## 2019-06-07 HISTORY — PX: WEIL OSTEOTOMY: SHX5044

## 2019-06-07 HISTORY — PX: OSTECTOMY: SHX6439

## 2019-06-07 HISTORY — PX: HAMMER TOE SURGERY: SHX385

## 2019-06-07 HISTORY — DX: Unspecified osteoarthritis, unspecified site: M19.90

## 2019-06-07 LAB — GLUCOSE, CAPILLARY
Glucose-Capillary: 127 mg/dL — ABNORMAL HIGH (ref 70–99)
Glucose-Capillary: 174 mg/dL — ABNORMAL HIGH (ref 70–99)

## 2019-06-07 SURGERY — OSTECTOMY
Anesthesia: General | Laterality: Right

## 2019-06-07 MED ORDER — OXYCODONE HCL 5 MG PO TABS: 5.0000 mg | ORAL_TABLET | Freq: Once | ORAL | Status: DC | PRN

## 2019-06-07 MED ORDER — BUPIVACAINE HCL (PF) 0.5 % IJ SOLN
INTRAMUSCULAR | Status: DC | PRN
  Administered 2019-06-07: 7.5 mL

## 2019-06-07 MED ORDER — FENTANYL CITRATE (PF) 100 MCG/2ML IJ SOLN
INTRAMUSCULAR | Status: DC | PRN
  Administered 2019-06-07 (×2): 25 ug via INTRAVENOUS

## 2019-06-07 MED ORDER — LIDOCAINE HCL (CARDIAC) PF 100 MG/5ML IV SOSY
PREFILLED_SYRINGE | INTRAVENOUS | Status: DC | PRN
  Administered 2019-06-07: 40 mg via INTRATRACHEAL

## 2019-06-07 MED ORDER — DEXMEDETOMIDINE HCL 200 MCG/2ML IV SOLN
INTRAVENOUS | Status: DC | PRN
  Administered 2019-06-07: 8 ug via INTRAVENOUS

## 2019-06-07 MED ORDER — PROPOFOL 10 MG/ML IV BOLUS
INTRAVENOUS | Status: DC | PRN
  Administered 2019-06-07: 200 mg via INTRAVENOUS

## 2019-06-07 MED ORDER — CEFAZOLIN SODIUM-DEXTROSE 2-4 GM/100ML-% IV SOLN
2.0000 g | INTRAVENOUS | Status: AC
  Administered 2019-06-07: 2 g via INTRAVENOUS

## 2019-06-07 MED ORDER — OXYCODONE HCL 5 MG/5ML PO SOLN: 5.0000 mg | Freq: Once | ORAL | Status: DC | PRN

## 2019-06-07 MED ORDER — ACETAMINOPHEN 160 MG/5ML PO SOLN: 325.0000 mg | ORAL | Status: DC | PRN

## 2019-06-07 MED ORDER — FENTANYL CITRATE (PF) 100 MCG/2ML IJ SOLN: 25.0000 ug | INTRAMUSCULAR | Status: DC | PRN

## 2019-06-07 MED ORDER — OXYCODONE-ACETAMINOPHEN 7.5-325 MG PO TABS: 1.0000 | ORAL_TABLET | ORAL | 0 refills | Status: DC | PRN

## 2019-06-07 MED ORDER — BUPIVACAINE LIPOSOME 1.3 % IJ SUSP
INTRAMUSCULAR | Status: DC | PRN
  Administered 2019-06-07: 10 mL
  Administered 2019-06-07: 7.5 mL

## 2019-06-07 MED ORDER — MIDAZOLAM HCL 5 MG/5ML IJ SOLN
INTRAMUSCULAR | Status: DC | PRN
  Administered 2019-06-07: 2 mg via INTRAVENOUS

## 2019-06-07 MED ORDER — EPHEDRINE SULFATE 50 MG/ML IJ SOLN
INTRAMUSCULAR | Status: DC | PRN
  Administered 2019-06-07 (×2): 5 mg via INTRAVENOUS
  Administered 2019-06-07: 10 mg via INTRAVENOUS
  Administered 2019-06-07 (×2): 5 mg via INTRAVENOUS

## 2019-06-07 MED ORDER — ACETAMINOPHEN 325 MG PO TABS: 325.0000 mg | ORAL_TABLET | ORAL | Status: DC | PRN

## 2019-06-07 MED ORDER — LACTATED RINGERS IV SOLN
INTRAVENOUS | Status: DC
  Administered 2019-06-07 (×2): via INTRAVENOUS

## 2019-06-07 MED ORDER — ONDANSETRON HCL 4 MG/2ML IJ SOLN
INTRAMUSCULAR | Status: DC | PRN
  Administered 2019-06-07: 4 mg via INTRAVENOUS

## 2019-06-07 MED ORDER — POVIDONE-IODINE 7.5 % EX SOLN: Freq: Once | CUTANEOUS | Status: DC

## 2019-06-07 MED ORDER — 0.9 % SODIUM CHLORIDE (POUR BTL) OPTIME
TOPICAL | Status: DC | PRN
  Administered 2019-06-07: 500 mL

## 2019-06-07 MED ORDER — DEXAMETHASONE SODIUM PHOSPHATE 4 MG/ML IJ SOLN
INTRAMUSCULAR | Status: DC | PRN
  Administered 2019-06-07: 4 mg via INTRAVENOUS

## 2019-06-07 SURGICAL SUPPLY — 77 items
0.045"x6" k-wire (Wire) ×2 IMPLANT
2.5 countersink ×1 IMPLANT
BANDAGE ACE 4X5 VEL STRL LF (GAUZE/BANDAGES/DRESSINGS) ×1 IMPLANT
BANDAGE ELASTIC 4 LF NS (GAUZE/BANDAGES/DRESSINGS) ×2 IMPLANT
BENZOIN TINCTURE PRP APPL 2/3 (GAUZE/BANDAGES/DRESSINGS) ×2 IMPLANT
BIT DRILL 1.7 LNG CANN (DRILL) ×1 IMPLANT
BLADE MED AGGRESSIVE (BLADE) IMPLANT
BLADE MINI RND TIP GREEN BEAV (BLADE) IMPLANT
BLADE OSC/SAGITTAL MD 5.5X18 (BLADE) ×1 IMPLANT
BLADE OSC/SAGITTAL MD 9X18.5 (BLADE) ×1 IMPLANT
BLADE OSCILLATING/SAGITTAL (BLADE) ×1
BLADE SURG 15 STRL LF DISP TIS (BLADE) IMPLANT
BLADE SURG 15 STRL SS (BLADE) ×1
BLADE SW THK.38XMED LNG THN (BLADE) IMPLANT
BNDG ESMARK 4X12 TAN STRL LF (GAUZE/BANDAGES/DRESSINGS) ×2 IMPLANT
BNDG GAUZE 4.5X4.1 6PLY STRL (MISCELLANEOUS) ×2 IMPLANT
BNDG STRETCH 4X75 STRL LF (GAUZE/BANDAGES/DRESSINGS) ×2 IMPLANT
CANISTER SUCT 1200ML W/VALVE (MISCELLANEOUS) ×2 IMPLANT
CNTRSNK DRL 2 HDLS SCR (MISCELLANEOUS) IMPLANT
COUNTERSINK 2.0 (MISCELLANEOUS) ×1
COVER LIGHT HANDLE UNIVERSAL (MISCELLANEOUS) ×4 IMPLANT
COVER PIN YLW 0.028-062 (MISCELLANEOUS) ×1 IMPLANT
CUFF TOURN SGL QUICK 18X4 (TOURNIQUET CUFF) ×1 IMPLANT
DRAPE FLUOR MINI C-ARM 54X84 (DRAPES) ×2 IMPLANT
DRILL WIRE PASS (DRILL) IMPLANT
DRSG TEGADERM 4X4.75 (GAUZE/BANDAGES/DRESSINGS) ×1 IMPLANT
DURAPREP 26ML APPLICATOR (WOUND CARE) ×2 IMPLANT
ELECT REM PT RETURN 9FT ADLT (ELECTROSURGICAL) ×2
ELECTRODE REM PT RTRN 9FT ADLT (ELECTROSURGICAL) ×1 IMPLANT
ETHIBOND 2 0 GREEN CT 2 30IN (SUTURE) ×1 IMPLANT
GAUZE SPONGE 4X4 12PLY STRL (GAUZE/BANDAGES/DRESSINGS) ×2 IMPLANT
GAUZE XEROFORM 1X8 LF (GAUZE/BANDAGES/DRESSINGS) ×1 IMPLANT
GLOVE BIO SURGEON STRL SZ8 (GLOVE) ×2 IMPLANT
GOWN STRL REUS W/ TWL LRG LVL3 (GOWN DISPOSABLE) ×1 IMPLANT
GOWN STRL REUS W/ TWL XL LVL3 (GOWN DISPOSABLE) ×1 IMPLANT
GOWN STRL REUS W/TWL LRG LVL3 (GOWN DISPOSABLE) ×1
GOWN STRL REUS W/TWL XL LVL3 (GOWN DISPOSABLE) ×1
K-WIRE DBL END TROCAR 6X.045 (WIRE)
K-WIRE DBL END TROCAR 6X.062 (WIRE)
KIT PROCEDURE DRILL (DRILL) ×1 IMPLANT
KIT TURNOVER KIT A (KITS) ×2 IMPLANT
KWIRE DBL END TROCAR 6X.045 (WIRE) IMPLANT
KWIRE DBL END TROCAR 6X.062 (WIRE) IMPLANT
NDL HYPO 18GX1.5 BLUNT FILL (NEEDLE) IMPLANT
NDL HYPO 25GX1X1/2 BEV (NEEDLE) IMPLANT
NEEDLE HYPO 18GX1.5 BLUNT FILL (NEEDLE) IMPLANT
NEEDLE HYPO 25GX1X1/2 BEV (NEEDLE) ×2 IMPLANT
NS IRRIG 500ML POUR BTL (IV SOLUTION) ×2 IMPLANT
PACK EXTREMITY ARMC (MISCELLANEOUS) ×2 IMPLANT
PENCIL SMOKE EVACUATOR (MISCELLANEOUS) ×2 IMPLANT
RASP SM TEAR CROSS CUT (RASP) ×2 IMPLANT
SCREW 2.0X11 HEADED (Screw) ×1 IMPLANT
SCREW 2.0X14 HEADED (Screw) ×1 IMPLANT
SCREW 2.5X24 HEADED (Screw) ×1 IMPLANT
SCREW CANNULATED HEADLESS 26MM (Screw) ×1 IMPLANT
SPLINT FAST PLASTER 5X30 (CAST SUPPLIES) ×1
SPLINT PLASTER CAST FAST 5X30 (CAST SUPPLIES) IMPLANT
STAPLE DYNACLIP 8X8 (Staple) ×1 IMPLANT
STOCKINETTE STRL 6IN 960660 (GAUZE/BANDAGES/DRESSINGS) ×2 IMPLANT
STRAP BODY AND KNEE 60X3 (MISCELLANEOUS) ×2 IMPLANT
STRIP CLOSURE SKIN 1/2X4 (GAUZE/BANDAGES/DRESSINGS) ×1 IMPLANT
STRIP CLOSURE SKIN 1/4X4 (GAUZE/BANDAGES/DRESSINGS) ×2 IMPLANT
SUT ETHILON 4-0 (SUTURE)
SUT ETHILON 4-0 FS2 18XMFL BLK (SUTURE)
SUT ETHILON 5-0 FS-2 18 BLK (SUTURE) ×1 IMPLANT
SUT VIC AB 1 CT1 36 (SUTURE) IMPLANT
SUT VIC AB 2-0 CT1 27 (SUTURE)
SUT VIC AB 2-0 CT1 TAPERPNT 27 (SUTURE) IMPLANT
SUT VIC AB 2-0 SH 27 (SUTURE)
SUT VIC AB 2-0 SH 27XBRD (SUTURE) IMPLANT
SUT VIC AB 3-0 SH 27 (SUTURE) ×1
SUT VIC AB 3-0 SH 27X BRD (SUTURE) IMPLANT
SUT VIC AB 4-0 FS2 27 (SUTURE) ×2 IMPLANT
SUT VICRYL AB 3-0 FS1 BRD 27IN (SUTURE) ×1 IMPLANT
SUTURE ETHLN 4-0 FS2 18XMF BLK (SUTURE) IMPLANT
SYR 10ML LL (SYRINGE) IMPLANT
WIRE SMOOTH TROCAR .9MMX150MML (WIRE) ×6 IMPLANT

## 2019-06-07 NOTE — Transfer of Care (Signed)
Immediate Anesthesia Transfer of Care Note  Patient: Dorothy Murray  Procedure(s) Performed: DOUBLE OSTEOTOMY RIGHT (Right ) WEIL OSTEOTOMY RIGHT 2ND (Right ) HAMMER TOE CORRECTION RIGHT 2ND (Right )  Patient Location: PACU  Anesthesia Type: General LMA  Level of Consciousness: awake, alert  and patient cooperative  Airway and Oxygen Therapy: Patient Spontanous Breathing and Patient connected to supplemental oxygen  Post-op Assessment: Post-op Vital signs reviewed, Patient's Cardiovascular Status Stable, Respiratory Function Stable, Patent Airway and No signs of Nausea or vomiting  Post-op Vital Signs: Reviewed and stable  Complications: No apparent anesthesia complications

## 2019-06-07 NOTE — Discharge Instructions (Signed)
[place] [address] [place]   1. Take your medication as prescribed.  Pain medication should be taken only as needed.  2. Keep the dressing clean, dry and intact.  3. Keep your foot elevated above the heart level for the first 48 hours.  4. Walking to the bathroom and brief periods of walking are acceptable, unless we have instructed you to be non-weight bearing.  I would recommend using a walker for assistance to keep 50% of the weight on your hands and arms and only 50% on the foot.  Please walk with a flat gait did not try to walk from heel-to-toe.  Do not try to push off with 3 toes.  Take very short strides with the right foot.  5. Always wear your post-op shoe when walking.  Do not take a shower. Baths are permissible as long as the foot is kept out of the water.  After 48 hours you can take a shower if you have a shower cast protector on.  Be very careful in a wet environment.  6. Every hour you are awake:  - Bend your knee 15 times. - Flex foot 15 times - Massage calf 15 times  7. Call [facility] [phone]) if any of the following problems occur: - You develop a temperature or fever. - The bandage becomes saturated with blood. - Medication does not stop your pain. - Injury of the foot occurs. - Any symptoms of infection including redness, odor, or red streaks running from wound.  General Anesthesia, Adult, Care After This sheet gives you information about how to care for yourself after your procedure. Your health care provider may also give you more specific instructions. If you have problems or questions, contact your health care provider. What can I expect after the procedure? After the procedure, the following side effects are common:  Pain or discomfort at the IV site.  Nausea.  Vomiting.  Sore throat.  Trouble  concentrating.  Feeling cold or chills.  Weak or tired.  Sleepiness and fatigue.  Soreness and body aches. These side effects can affect parts of the body that were not involved in surgery. Follow these instructions at home:  For at least 24 hours after the procedure:  Have a responsible adult stay with you. It is important to have someone help care for you until you are awake and alert.  Rest as needed.  Do not: ? Participate in activities in which you could fall or become injured. ? Drive. ? Use heavy machinery. ? Drink alcohol. ? Take sleeping pills or medicines that cause drowsiness. ? Make important decisions or sign legal documents. ? Take care of children on your own. Eating and drinking  Follow any instructions from your health care provider about eating or drinking restrictions.  When you feel hungry, start by eating small amounts of foods that are soft and easy to digest (bland), such as toast. Gradually return to your regular diet.  Drink enough fluid to keep your urine pale yellow.  If you vomit, rehydrate by drinking water, juice, or clear broth. General instructions  If you have sleep apnea, surgery and certain medicines can increase your risk for breathing problems. Follow instructions from your health care provider about wearing your sleep device: ? Anytime you are sleeping, including during daytime naps. ? While taking prescription pain medicines, sleeping medicines, or medicines that make you drowsy.  Return  to your normal activities as told by your health care provider. Ask your health care provider what activities are safe for you.  Take over-the-counter and prescription medicines only as told by your health care provider.  If you smoke, do not smoke without supervision.  Keep all follow-up visits as told by your health care provider. This is important. Contact a health care provider if:  You have nausea or vomiting that does not get better with  medicine.  You cannot eat or drink without vomiting.  You have pain that does not get better with medicine.  You are unable to pass urine.  You develop a skin rash.  You have a fever.  You have redness around your IV site that gets worse. Get help right away if:  You have difficulty breathing.  You have chest pain.  You have blood in your urine or stool, or you vomit blood. Summary  After the procedure, it is common to have a sore throat or nausea. It is also common to feel tired.  Have a responsible adult stay with you for the first 24 hours after general anesthesia. It is important to have someone help care for you until you are awake and alert.  When you feel hungry, start by eating small amounts of foods that are soft and easy to digest (bland), such as toast. Gradually return to your regular diet.  Drink enough fluid to keep your urine pale yellow.  Return to your normal activities as told by your health care provider. Ask your health care provider what activities are safe for you. This information is not intended to replace advice given to you by your health care provider. Make sure you discuss any questions you have with your health care provider. Document Released: 03/07/2001 Document Revised: 07/15/2017 Document Reviewed: 07/15/2017 Elsevier Interactive Patient Education  2019 [employer].

## 2019-06-07 NOTE — Anesthesia Postprocedure Evaluation (Signed)
Anesthesia Post Note  Patient: Dorothy Murray  Procedure(s) Performed: DOUBLE OSTEOTOMY RIGHT (Right ) WEIL OSTEOTOMY RIGHT 2ND (Right ) HAMMER TOE CORRECTION RIGHT 2ND (Right )  Patient location during evaluation: PACU Anesthesia Type: General Level of consciousness: awake and alert Pain management: pain level controlled Vital Signs Assessment: post-procedure vital signs reviewed and stable Respiratory status: spontaneous breathing, nonlabored ventilation, respiratory function stable and patient connected to nasal cannula oxygen Cardiovascular status: blood pressure returned to baseline and stable Postop Assessment: no apparent nausea or vomiting Anesthetic complications: no    Arsalan Brisbin

## 2019-06-07 NOTE — Op Note (Signed)
Operative note   Surgeon: Dr. Albertine Patricia, DPM.    Assistant: None    Preop diagnosis: 1.  Hallux abductovalgus right foot 2.  Fifth metatarsal abductovalgus right foot 3.  Plantar displaced second metatarsal right foot 4.  Hammertoe deformity with early overlap second toe right foot    Postop diagnosis: Same    Procedure:   1.  Austin Akin hallux valgus procedure right foot with Paragon headless screw and bone staple   2.  Osteotomy second metatarsal right foot with 2 screw fixation from Paragon   3.  Osteotomy fifth metatarsal right foot with K wire buried fixation 4.  Hammertoe repair and overlapping toe repair with PIP joint fusion and flexor tendon transfer     EBL: 15 cc    Anesthesia:general delivered by the anesthesia team I utilized Marcaine mixed with Exparel preoperatively and straight Exparel postoperatively.  Total of 20 cc of Exparel were used and total of 5 cc of Marcaine with    Hemostasis: Ankle tourniquet 225 mmHg pressure 410 minutes    Specimen: None    Complications: None    Operative indications: Chronic pain discomfort and deformity unresponsive to conservative care    Procedure:  Patient was brought into the OR and placed on the operating table in thesupine position. After anesthesia was obtained theright lower extremity was prepped and draped in usual sterile fashion.  Operative Report: This time attention directed to the right first metatarsal phalangeal joint area where a 4 cm linear incision was made extending over the MTP joint extending over the proximal phalanx shaft.  Incision was deepened sharp blunt dissection bleeders clamped and bovied as required.  This time Tissue overlying the joint was incised longitudinally reflected away from dorsal medial plantar aspects of the metatarsal head.  The midportion of the shaft of the proximal phalanx was cleaned off as well.  This also extends to the proximal shaft.  This time a large medial dorsal medial  eminence of bone was noted on the metatarsal head was resected and rasped smoothly.  Digit was then directed lateral aspect joint where a lateral capsulotomy was performed.  This point a V osteotomy was then performed to the metatarsal head apex distal base proximal.  Head of metatarsals and transposed more lateral and slight plantar position and fixated with a 2.8 headless screw from Paragon mini monster set.  This was checked for single position correction were noted.  The medial shelf was then resected with smoothly.  Medial capsulorrhaphy was performed and closed with 3-0 Vicryl simple sutures.  Next the proximal phalanx was addressed and a wedge osteotomy was performed with apex lateral base medial the wedge was closed and feathered and fixated with a bone staple 8 x 8 x 8.  There is checked for skin good position correction fixation were noted to both sites.  This time the main Tissue was closed with 3-0 Vicryl simple erupted stitch.  Deep superficial fascia was then closed in similar fashion.  Skin was closed with 4-0 Vicryl subcuticular stitch.  Procedures 2 and 3.  This time to direct to the second toe of the second MTP joint area where a curvilinear incision over the MTP joint and extended into the linear incision over the PIP joint of the second toe.  Incision was deepened sharp blunt dissection bleeders clamped and bovied described.  Extensor tendon was notified and released medially and retracted laterally this point an incision was made longitudinally through the joint capsule and periosteum over  the distal shaft and head of the metatarsal.  This was dissected medially and laterally.  A transverse capsulotomy was performed at the MTP joint.  An osteotomy was then performed the second metatarsal head starting at the osteochondral junction going from dorsal distal to proximal plantar.  Once this is a through and through osteotomy the head of metatarsals and transposed to a more lateral position and  fixated temporarily with 2 wires from the 2.2 Paragon mini monster set.  0 check for single position correction were noted.  At this point 2 screws were placed across the area at that time frame.  At this point a lateral capsular repair was done and closed with 2 simple interrupted sutures of 2-0 Ethibond.  A medial capsular release was performed.  At this time attention was directed to the PIP joint of the second toe where the extensor tendon was incised transversely reflected proximally.  This was cleaned to the midshaft portion of the proximal phalanx for future flexor tendon transfer.  The tendon was retracted distally from the middle phalanx base.  Articular cartilage was removed from both these areas.  Once is accomplished today then incision was made through the plantar PIP joint capsule the long extensor tendon was isolated and released just at the middle phalanx base.  This was then separated over the midshaft portion of the proximal phalanx and sutured with 4-0 Vicryl simple erupted sutures across that area.  Once this was accomplished toe was sitting in a much better position a point 5 K wire was then drilled through the middle distal phalanges retrograded into the proximal phalanx good apposition of the 2 components were noted.  Care was then run through the metatarsal head to hold the toe abdominal condition.  The area was checked for single position correction were noted at this timeframe.  All areas were copiously irrigated and the extensor tendon was reapproximated over the PIP joint with 3 of simple interrupted sutures of 3-0 Vicryl.  Periosteum capsule tissue of the metatarsal was then closed with 3-0 Vicryl in a continuous stitch as were deep superficial fascial layers.  Skin was closed proximally with 4-0 Vicryl subcuticular stitch.  Over the toe the area was closed with 5-0 nylon horizontal mattress sutures.  The stump is directed to the fifth metatarsal where a 3 cm linear incision is made  and deepened with sharp blunt dissection bleeders clamped and bovied as required this was at the MTP joint level.  The capsular periosteal tissue was then identified incised longitudinally reflected dorsally laterally plantarly from the metatarsal head.  A lateral and dorsal lateral eminence of bone was noted and resected with power equipment and rasped smoothly.  This point a osteotomy was performed with a short dorsal arm slightly angled in a longer plantar arm.  The head of metatarsals and transposed more medial position fixated with 0.045 K wire.  Checked for single position correction were noted.  K wire was then cut bent and rotated and buried up against the bone.  The lateral shelf from the area was then rasped smoothly.  There was an copiously irrigated and periosteum capsule tissue was closed with 4-0 Vicryl in continuous stitch.  Fascial fascia was closed similarly.  Skin was closed with 4-0 Vicryl in a subcuticular stitch.  This time the area was blocked with straight Exparel.  Sterile compressive dressings were placed across wound assisting with Steri-Strips Xeroform gauze 4 x 4's Kling and Kerlix the tourniquet was released proximally vascular seen return  to all digits.  A posterior splint was placed on the right foot and leg in the operating room.  This consisted of a 4 x 30 fiberglass mixed with 10 layers of plaster.    Patient tolerated the procedure and anesthesia well.  Was transported from the OR to the PACU with all vital signs stable and vascular status intact. To be discharged per routine protocol.  Will follow up in approximately 1 week in the outpatient clinic.

## 2019-06-07 NOTE — Anesthesia Procedure Notes (Signed)
Procedure Name: LMA Insertion Date/Time: 06/07/2019 7:46 AM Performed by: Janna Arch, CRNA Pre-anesthesia Checklist: Patient identified, Emergency Drugs available, Suction available, Timeout performed and Patient being monitored Patient Re-evaluated:Patient Re-evaluated prior to induction Oxygen Delivery Method: Circle system utilized Preoxygenation: Pre-oxygenation with 100% oxygen Induction Type: IV induction LMA: LMA inserted LMA Size: 4.0 Number of attempts: 1 Placement Confirmation: positive ETCO2 and breath sounds checked- equal and bilateral Tube secured with: Tape

## 2019-06-07 NOTE — Anesthesia Preprocedure Evaluation (Signed)
Anesthesia Evaluation  Patient identified by MRN, date of birth, ID band Patient awake    Reviewed: NPO status   History of Anesthesia Complications Negative for: history of anesthetic complications  Airway Mallampati: II  TM Distance: >3 FB Neck ROM: full    Dental no notable dental hx.    Pulmonary neg pulmonary ROS,    Pulmonary exam normal        Cardiovascular Exercise Tolerance: Good Normal cardiovascular exam+ dysrhythmias (palpitations with caffeine)      Neuro/Psych negative neurological ROS  negative psych ROS   GI/Hepatic negative GI ROS, Neg liver ROS, H/o crohn's dz;    Endo/Other  diabetesMorbid obesity (bmi 28)  Renal/GU negative Renal ROS  negative genitourinary   Musculoskeletal  (+) Arthritis ,   Abdominal   Peds  Hematology negative hematology ROS (+)   Anesthesia Other Findings Covid: NEG  Reproductive/Obstetrics                             Anesthesia Physical Anesthesia Plan  ASA: II  Anesthesia Plan: General LMA   Post-op Pain Management:    Induction:   PONV Risk Score and Plan:   Airway Management Planned:   Additional Equipment:   Intra-op Plan:   Post-operative Plan:   Informed Consent: I have reviewed the patients History and Physical, chart, labs and discussed the procedure including the risks, benefits and alternatives for the proposed anesthesia with the patient or authorized representative who has indicated his/her understanding and acceptance.       Plan Discussed with: CRNA  Anesthesia Plan Comments:         Anesthesia Quick Evaluation

## 2019-06-07 NOTE — H&P (Signed)
H and P has been reviewed and no changes are noted.  

## 2019-06-08 ENCOUNTER — Encounter: Payer: Self-pay | Admitting: Podiatry

## 2019-06-11 ENCOUNTER — Other Ambulatory Visit: Payer: Self-pay | Admitting: *Deleted

## 2019-06-11 DIAGNOSIS — Z1231 Encounter for screening mammogram for malignant neoplasm of breast: Secondary | ICD-10-CM

## 2019-06-13 ENCOUNTER — Encounter: Payer: Self-pay | Admitting: *Deleted

## 2019-06-13 DIAGNOSIS — M2011 Hallux valgus (acquired), right foot: Secondary | ICD-10-CM | POA: Diagnosis not present

## 2019-06-20 DIAGNOSIS — Z794 Long term (current) use of insulin: Secondary | ICD-10-CM | POA: Diagnosis not present

## 2019-06-20 DIAGNOSIS — E781 Pure hyperglyceridemia: Secondary | ICD-10-CM | POA: Diagnosis not present

## 2019-06-20 DIAGNOSIS — E1165 Type 2 diabetes mellitus with hyperglycemia: Secondary | ICD-10-CM | POA: Diagnosis not present

## 2019-06-20 DIAGNOSIS — Z9889 Other specified postprocedural states: Secondary | ICD-10-CM | POA: Diagnosis not present

## 2019-06-20 DIAGNOSIS — M2011 Hallux valgus (acquired), right foot: Secondary | ICD-10-CM | POA: Diagnosis not present

## 2019-06-20 DIAGNOSIS — I10 Essential (primary) hypertension: Secondary | ICD-10-CM | POA: Diagnosis not present

## 2019-07-04 DIAGNOSIS — M2011 Hallux valgus (acquired), right foot: Secondary | ICD-10-CM | POA: Diagnosis not present

## 2019-07-18 DIAGNOSIS — M2011 Hallux valgus (acquired), right foot: Secondary | ICD-10-CM | POA: Diagnosis not present

## 2019-07-19 ENCOUNTER — Ambulatory Visit
Admission: RE | Admit: 2019-07-19 | Discharge: 2019-07-19 | Disposition: A | Discharge status: Home / Self Care | Payer: PPO | Source: Ambulatory Visit | Attending: General Surgery | Admitting: General Surgery

## 2019-07-19 DIAGNOSIS — Z1231 Encounter for screening mammogram for malignant neoplasm of breast: Secondary | ICD-10-CM | POA: Diagnosis not present

## 2019-07-20 ENCOUNTER — Other Ambulatory Visit: Payer: Self-pay | Admitting: Surgery

## 2019-07-20 DIAGNOSIS — R928 Other abnormal and inconclusive findings on diagnostic imaging of breast: Secondary | ICD-10-CM

## 2019-07-26 ENCOUNTER — Ambulatory Visit: Payer: PPO | Admitting: General Surgery

## 2019-07-30 ENCOUNTER — Ambulatory Visit: Payer: PPO | Attending: Surgery

## 2019-07-30 ENCOUNTER — Inpatient Hospital Stay: Admission: RE | Admit: 2019-07-30 | Payer: PPO | Source: Ambulatory Visit

## 2019-08-08 ENCOUNTER — Ambulatory Visit
Admission: RE | Admit: 2019-08-08 | Discharge: 2019-08-08 | Disposition: A | Discharge status: Home / Self Care | Payer: PPO | Source: Ambulatory Visit | Attending: Surgery | Admitting: Surgery

## 2019-08-08 DIAGNOSIS — R922 Inconclusive mammogram: Secondary | ICD-10-CM | POA: Diagnosis not present

## 2019-08-08 DIAGNOSIS — R928 Other abnormal and inconclusive findings on diagnostic imaging of breast: Secondary | ICD-10-CM | POA: Diagnosis not present

## 2019-08-10 DIAGNOSIS — F331 Major depressive disorder, recurrent, moderate: Secondary | ICD-10-CM | POA: Diagnosis not present

## 2019-08-10 DIAGNOSIS — Z9889 Other specified postprocedural states: Secondary | ICD-10-CM | POA: Diagnosis not present

## 2019-08-10 DIAGNOSIS — Z794 Long term (current) use of insulin: Secondary | ICD-10-CM | POA: Diagnosis not present

## 2019-08-10 DIAGNOSIS — E1165 Type 2 diabetes mellitus with hyperglycemia: Secondary | ICD-10-CM | POA: Diagnosis not present

## 2019-08-10 DIAGNOSIS — I10 Essential (primary) hypertension: Secondary | ICD-10-CM | POA: Diagnosis not present

## 2019-08-10 DIAGNOSIS — R000 Tachycardia, unspecified: Secondary | ICD-10-CM | POA: Diagnosis not present

## 2019-08-10 DIAGNOSIS — R457 State of emotional shock and stress, unspecified: Secondary | ICD-10-CM | POA: Diagnosis not present

## 2019-08-13 ENCOUNTER — Encounter: Payer: Self-pay | Admitting: Surgery

## 2019-08-13 ENCOUNTER — Ambulatory Visit (INDEPENDENT_AMBULATORY_CARE_PROVIDER_SITE_OTHER): Payer: PPO | Admitting: Surgery

## 2019-08-13 ENCOUNTER — Other Ambulatory Visit: Payer: Self-pay

## 2019-08-13 VITALS — BP 158/85 | HR 77 | Temp 97.5°F | Ht 65.5 in | Wt 177.0 lb

## 2019-08-13 DIAGNOSIS — N6091 Unspecified benign mammary dysplasia of right breast: Secondary | ICD-10-CM

## 2019-08-13 NOTE — Patient Instructions (Signed)
Patient will be asked to return to the office in one year with a bilateral screening mammogram.  Continue self breast exams. Call office for any new breast issues or concerns.

## 2019-08-14 NOTE — Progress Notes (Signed)
Patient ID: [name], female   DOB: 1951/01/10, 68 y.o.   MRN: [id number]  HPI Dorothy Murray is a 68 y.o. female Dorothy Murray is a 68 year old female with a history of atypical ductal hyperplasia status post lumpectomy by Dr. Jamal Collin on May 2017 no dysplasia.  She is following up after having a mammogram that I have personally reviewed showing no evidence of new concerning lesions.  She also denies any new lumps bumps or nipple discharge.  No weight loss no fevers no chills.  She does self breast exams. Path and op reports pers. Reviewed. HPI  Past Medical History:  Diagnosis Date  . Arthritis    lower back  . Crohn's disease ([place])   . 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 Diffuse cystic mastopathy   . Dysrhythmia     Past Surgical History:  Procedure Laterality Date  . BACK SURGERY  2019  . BREAST BIOPSY Bilateral 04/19/2016   stereo w/ Dr. Bary Castilla, benign  . BREAST EXCISIONAL BIOPSY Right 04/19/2016   w/Dr. Bary Castilla, benign   . BREAST LUMPECTOMY Right 05/03/2016   ATYPICAL DUCTAL HYPERPLASIA (ADH) WITH MICROCALCIFICATIONS/: BREAST LUMPECTOMY; Ellwood Sayers, MD;  Location: [facility];  Service: General;  Laterality: Right;  . BREAST LUMPECTOMY WITH NEEDLE LOCALIZATION Right 05/03/2016   Procedure: BREAST LUMPECTOMY WITH NEEDLE LOCALIZATION;  Surgeon: Christene Lye, MD;  Location: [facility];  Service: General;  Laterality: Right;  . CHOLECYSTECTOMY    . COLONOSCOPY  2010, 2015   [facility]  . COLONOSCOPY  12/2013  . COLONOSCOPY WITH PROPOFOL N/A 07/17/2018   Procedure: COLONOSCOPY WITH PROPOFOL;  Surgeon: Manya Silvas, MD;  Location: [facility];  Service: Endoscopy;  Laterality: N/A;  . HAMMER TOE SURGERY Right 06/07/2019   Procedure: HAMMER TOE CORRECTION RIGHT 2ND;  Surgeon: Albertine Patricia, DPM;  Location: [place];  Service: Podiatry;  Laterality: Right;  Diabetes - insulin and oral meds  . OSTECTOMY Right 06/07/2019   Procedure: DOUBLE OSTEOTOMY  RIGHT;  Surgeon: Albertine Patricia, DPM;  Location: [place];  Service: Podiatry;  Laterality: Right;  . WEIL OSTEOTOMY Right 06/07/2019   Procedure: WEIL OSTEOTOMY RIGHT 2ND;  Surgeon: Albertine Patricia, DPM;  Location: [place];  Service: Podiatry;  Laterality: Right;    Family History  Problem Relation Age of Onset  . Breast cancer Sister   . Breast cancer Daughter 19       [name]  . Heart attack Mother   . Heart attack Father   . Stomach cancer Brother   . Stomach cancer Sister   . Colon cancer Son 78    Social History Social History   Tobacco Use  . Smoking status: Never Smoker  . Smokeless tobacco: Never Used  Substance Use Topics  . Alcohol use: No  . Drug use: No    No Known Allergies  Current Outpatient Medications  Medication Sig Dispense Refill  . aspirin EC 81 MG tablet Take by mouth.    . carvedilol (COREG) 3.125 MG tablet Take 3.125 mg by mouth 2 (two) times daily with a meal.    . CINNAMON PO Take 1 tablet by mouth daily.    . ergocalciferol (VITAMIN D2) 50000 units capsule Take 50,000 Units by mouth once a week.    Marland Kitchen GARLIC PO Take 1 tablet by mouth daily.    Marland Kitchen glipiZIDE (GLUCOTROL) 10 MG tablet Take 10 mg by mouth 2 (two) times daily.  5  . insulin  glargine (LANTUS) 100 UNIT/ML injection Inject 20 Units into the skin at bedtime.    . metFORMIN (GLUCOPHAGE) 1000 MG tablet Take 1,000 mg by mouth 2 (two) times daily.  5   No current facility-administered medications for this visit.      Review of Systems Full ROS  was asked and was negative except for the information on the HPI  Physical Exam Blood pressure (!) 158/85, pulse 77, temperature (!) 97.5 F (36.4 C), temperature source Oral, height 5' 5.5" (1.664 m), weight 177 lb (80.3 kg), SpO2 95 %. CONSTITUTIONAL: NAD EYES: Pupils are equal, round, and reactive to light, Sclera are non-icteric. LYMPH NODES:  Lymph nodes in the neck are normal. RESPIRATORY:  Lungs are clear.  There is normal respiratory effort, with equal breath sounds bilaterally, and without pathologic use of accessory muscles. CARDIOVASCULAR: Heart is regular without murmurs, gallops, or rubs. BREAST: previous lumpectomy site, no new masses, no LAD, no DC or nipple changes. GI: The abdomen is  soft, nontender, and nondistended. There are no palpable masses. There is no hepatosplenomegaly. There are normal bowel sounds in all quadrants. GU: Rectal deferred.   MUSCULOSKELETAL: Normal muscle strength and tone. No cyanosis or edema.   SKIN: Turgor is good and there are no pathologic skin lesions or ulcers. NEUROLOGIC: Motor and sensation is grossly normal. Cranial nerves are grossly intact. PSYCH:  Oriented to person, place and time. Affect is normal.  Data Reviewed I have personally reviewed the patient's imaging, laboratory findings and medical records.    Assessment/Plan 68 year old following up with normal breast exam and no evidence of worrisome lesions on mammogram.  Recommend one-year follow-up with physical and mammogram. Time spent with the patient was 25 minutes, with more than 50% of the time spent in face-to-face education, counseling and care coordination.     Caroleen Hamman, MD FACS General Surgeon 08/14/2019, 3:19 PM

## 2019-08-24 DIAGNOSIS — I10 Essential (primary) hypertension: Secondary | ICD-10-CM | POA: Diagnosis not present

## 2019-11-06 DIAGNOSIS — R8290 Unspecified abnormal findings in urine: Secondary | ICD-10-CM | POA: Diagnosis not present

## 2019-11-06 DIAGNOSIS — Z9889 Other specified postprocedural states: Secondary | ICD-10-CM | POA: Diagnosis not present

## 2019-11-06 DIAGNOSIS — I10 Essential (primary) hypertension: Secondary | ICD-10-CM | POA: Diagnosis not present

## 2019-11-06 DIAGNOSIS — M2011 Hallux valgus (acquired), right foot: Secondary | ICD-10-CM | POA: Diagnosis not present

## 2019-11-06 DIAGNOSIS — E1165 Type 2 diabetes mellitus with hyperglycemia: Secondary | ICD-10-CM | POA: Diagnosis not present

## 2019-11-06 DIAGNOSIS — Z794 Long term (current) use of insulin: Secondary | ICD-10-CM | POA: Diagnosis not present

## 2019-11-06 DIAGNOSIS — E781 Pure hyperglyceridemia: Secondary | ICD-10-CM | POA: Diagnosis not present

## 2019-11-14 DIAGNOSIS — Z23 Encounter for immunization: Secondary | ICD-10-CM | POA: Diagnosis not present

## 2019-11-14 DIAGNOSIS — Z0000 Encounter for general adult medical examination without abnormal findings: Secondary | ICD-10-CM | POA: Diagnosis not present

## 2019-11-14 DIAGNOSIS — Z794 Long term (current) use of insulin: Secondary | ICD-10-CM | POA: Diagnosis not present

## 2019-11-14 DIAGNOSIS — E1165 Type 2 diabetes mellitus with hyperglycemia: Secondary | ICD-10-CM | POA: Diagnosis not present

## 2019-11-14 DIAGNOSIS — K50813 Crohn's disease of both small and large intestine with fistula: Secondary | ICD-10-CM | POA: Diagnosis not present

## 2019-11-14 DIAGNOSIS — E781 Pure hyperglyceridemia: Secondary | ICD-10-CM | POA: Diagnosis not present

## 2020-02-04 DIAGNOSIS — Z9889 Other specified postprocedural states: Secondary | ICD-10-CM | POA: Diagnosis not present

## 2020-02-04 DIAGNOSIS — M2011 Hallux valgus (acquired), right foot: Secondary | ICD-10-CM | POA: Diagnosis not present

## 2020-03-10 DIAGNOSIS — K50813 Crohn's disease of both small and large intestine with fistula: Secondary | ICD-10-CM | POA: Diagnosis not present

## 2020-03-10 DIAGNOSIS — E781 Pure hyperglyceridemia: Secondary | ICD-10-CM | POA: Diagnosis not present

## 2020-03-10 DIAGNOSIS — E1165 Type 2 diabetes mellitus with hyperglycemia: Secondary | ICD-10-CM | POA: Diagnosis not present

## 2020-03-10 DIAGNOSIS — Z794 Long term (current) use of insulin: Secondary | ICD-10-CM | POA: Diagnosis not present

## 2020-03-10 DIAGNOSIS — Z0000 Encounter for general adult medical examination without abnormal findings: Secondary | ICD-10-CM | POA: Diagnosis not present

## 2020-03-17 DIAGNOSIS — Z6829 Body mass index (BMI) 29.0-29.9, adult: Secondary | ICD-10-CM | POA: Diagnosis not present

## 2020-03-17 DIAGNOSIS — Z9889 Other specified postprocedural states: Secondary | ICD-10-CM | POA: Diagnosis not present

## 2020-03-17 DIAGNOSIS — Z794 Long term (current) use of insulin: Secondary | ICD-10-CM | POA: Diagnosis not present

## 2020-03-17 DIAGNOSIS — M4316 Spondylolisthesis, lumbar region: Secondary | ICD-10-CM | POA: Diagnosis not present

## 2020-03-17 DIAGNOSIS — K50813 Crohn's disease of both small and large intestine with fistula: Secondary | ICD-10-CM | POA: Diagnosis not present

## 2020-03-17 DIAGNOSIS — F4321 Adjustment disorder with depressed mood: Secondary | ICD-10-CM | POA: Diagnosis not present

## 2020-03-17 DIAGNOSIS — Z23 Encounter for immunization: Secondary | ICD-10-CM | POA: Diagnosis not present

## 2020-03-17 DIAGNOSIS — Z0000 Encounter for general adult medical examination without abnormal findings: Secondary | ICD-10-CM | POA: Diagnosis not present

## 2020-03-17 DIAGNOSIS — E781 Pure hyperglyceridemia: Secondary | ICD-10-CM | POA: Diagnosis not present

## 2020-03-17 DIAGNOSIS — E1165 Type 2 diabetes mellitus with hyperglycemia: Secondary | ICD-10-CM | POA: Diagnosis not present

## 2020-05-30 ENCOUNTER — Other Ambulatory Visit: Payer: Self-pay

## 2020-05-30 DIAGNOSIS — Z1231 Encounter for screening mammogram for malignant neoplasm of breast: Secondary | ICD-10-CM

## 2020-07-14 DIAGNOSIS — Z794 Long term (current) use of insulin: Secondary | ICD-10-CM | POA: Diagnosis not present

## 2020-07-14 DIAGNOSIS — Z9889 Other specified postprocedural states: Secondary | ICD-10-CM | POA: Diagnosis not present

## 2020-07-14 DIAGNOSIS — E1165 Type 2 diabetes mellitus with hyperglycemia: Secondary | ICD-10-CM | POA: Diagnosis not present

## 2020-07-14 DIAGNOSIS — E781 Pure hyperglyceridemia: Secondary | ICD-10-CM | POA: Diagnosis not present

## 2020-07-14 DIAGNOSIS — M4316 Spondylolisthesis, lumbar region: Secondary | ICD-10-CM | POA: Diagnosis not present

## 2020-07-14 DIAGNOSIS — R8290 Unspecified abnormal findings in urine: Secondary | ICD-10-CM | POA: Diagnosis not present

## 2020-07-14 DIAGNOSIS — K50813 Crohn's disease of both small and large intestine with fistula: Secondary | ICD-10-CM | POA: Diagnosis not present

## 2020-07-19 ENCOUNTER — Emergency Department
Admission: EM | Admit: 2020-07-19 | Discharge: 2020-07-19 | Disposition: A | Discharge status: Home / Self Care | Payer: PPO | Attending: Emergency Medicine | Admitting: Emergency Medicine

## 2020-07-19 DIAGNOSIS — L509 Urticaria, unspecified: Secondary | ICD-10-CM | POA: Diagnosis not present

## 2020-07-19 DIAGNOSIS — Z5321 Procedure and treatment not carried out due to patient leaving prior to being seen by health care provider: Secondary | ICD-10-CM | POA: Diagnosis not present

## 2020-07-19 HISTORY — DX: Essential (primary) hypertension: I10

## 2020-07-19 MED ORDER — PREDNISONE 20 MG PO TABS
60.0000 mg | ORAL_TABLET | Freq: Once | ORAL | Status: DC
  Filled 2020-07-19: qty 3

## 2020-07-19 MED ORDER — FAMOTIDINE 20 MG PO TABS
20.0000 mg | ORAL_TABLET | Freq: Once | ORAL | Status: AC
  Administered 2020-07-19: 20 mg via ORAL
  Filled 2020-07-19: qty 1

## 2020-07-19 MED ORDER — DIPHENHYDRAMINE HCL 25 MG PO CAPS
ORAL_CAPSULE | ORAL | Status: AC
  Filled 2020-07-19: qty 1

## 2020-07-19 MED ORDER — DIPHENHYDRAMINE HCL 25 MG PO CAPS
50.0000 mg | ORAL_CAPSULE | Freq: Once | ORAL | Status: AC
  Administered 2020-07-19: 50 mg via ORAL
  Filled 2020-07-19: qty 2

## 2020-07-19 NOTE — ED Notes (Signed)
Patient does not want to take the Prednisone at this time as she is a diabetic. Has concerns over her sugar going up. Will hold this medication at this time.

## 2020-07-19 NOTE — ED Triage Notes (Signed)
Patient to ED for hives. Took first dose of CIpro last night at 2130 and around 0200 started with hives. Hives are on arms, legs, torso, and back. Patient is itchy. Medications given in triage.

## 2020-07-19 NOTE — ED Notes (Signed)
Patient has decided she would like to leave and followup with urgent care this morning. Hives has abated somewhat. Patient is no longer itchy and denies having any difficulty breathing, no tongue swelling is noted, and patient is able to speak in complete sentences without difficulty. Patient encouraged to return should the hives become worse or if she has shortness of breath or chest pain. Patient is amenable to that plan.

## 2020-07-21 DIAGNOSIS — Z9889 Other specified postprocedural states: Secondary | ICD-10-CM | POA: Diagnosis not present

## 2020-07-21 DIAGNOSIS — K50813 Crohn's disease of both small and large intestine with fistula: Secondary | ICD-10-CM | POA: Diagnosis not present

## 2020-07-21 DIAGNOSIS — E1165 Type 2 diabetes mellitus with hyperglycemia: Secondary | ICD-10-CM | POA: Diagnosis not present

## 2020-07-21 DIAGNOSIS — M4316 Spondylolisthesis, lumbar region: Secondary | ICD-10-CM | POA: Diagnosis not present

## 2020-07-21 DIAGNOSIS — Z794 Long term (current) use of insulin: Secondary | ICD-10-CM | POA: Diagnosis not present

## 2020-08-08 ENCOUNTER — Other Ambulatory Visit: Payer: Self-pay

## 2020-08-08 ENCOUNTER — Ambulatory Visit
Admission: RE | Admit: 2020-08-08 | Discharge: 2020-08-08 | Disposition: A | Discharge status: Home / Self Care | Payer: PPO | Source: Ambulatory Visit | Attending: Surgery | Admitting: Surgery

## 2020-08-08 DIAGNOSIS — Z1231 Encounter for screening mammogram for malignant neoplasm of breast: Secondary | ICD-10-CM | POA: Diagnosis not present

## 2020-08-11 ENCOUNTER — Telehealth: Payer: Self-pay

## 2020-08-11 NOTE — Telephone Encounter (Signed)
-----   Message from Jules Husbands, MD sent at 08/09/2020  7:01 AM EDT ----- Please let the pt know mammo was nml ----- Message ----- From: Interface, Rad Results In Sent: 08/08/2020   4:42 PM EDT To: Jules Husbands, MD

## 2020-08-11 NOTE — Telephone Encounter (Signed)
Per Dr.Pabon advise clinical team to contact patient to notify her of recent normal mammogram results. Patient verbalized understanding and had no further questions. Patient made aware of appt on 08/25/20 at 10:00am

## 2020-08-25 ENCOUNTER — Ambulatory Visit (INDEPENDENT_AMBULATORY_CARE_PROVIDER_SITE_OTHER): Payer: PPO | Admitting: Surgery

## 2020-08-25 ENCOUNTER — Other Ambulatory Visit: Payer: Self-pay

## 2020-08-25 ENCOUNTER — Encounter: Payer: Self-pay | Admitting: Surgery

## 2020-08-25 VITALS — BP 119/74 | HR 97 | Temp 98.4°F | Ht 65.5 in | Wt 175.0 lb

## 2020-08-25 DIAGNOSIS — N6091 Unspecified benign mammary dysplasia of right breast: Secondary | ICD-10-CM | POA: Diagnosis not present

## 2020-08-25 NOTE — Patient Instructions (Signed)
Patient will be asked to return to the office in one year with a bilateral screening mammogram.  Continue self breast exams. Call office for any new breast issues or concerns.

## 2020-08-25 NOTE — Progress Notes (Signed)
Outpatient Surgical Follow Up  08/25/2020  Dorothy Murray is an 69 y.o. female.   Chief Complaint  Patient presents with  . Follow-up    HPI: Dorothy Murray is a 70 y.o. female with a history of atypical ductal hyperplasia status post Right lumpectomy by Dr. Jamal Collin on May 2017 .  She is following up after having a mammogram that I have personally reviewed showing no evidence of new concerning lesions.  She also denies any new lumps bumps or nipple discharge.  No weight loss no fevers no chills.  She does self breast exams. Last colonoscopy 2019 a couple TA resected. NO changes in her health   Past Medical History:  Diagnosis Date  . Arthritis    lower back  . Crohn's disease ([place])   . 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 Diffuse cystic mastopathy   . Dysrhythmia   . Hypertension     Past Surgical History:  Procedure Laterality Date  . BACK SURGERY  2019  . BREAST BIOPSY Bilateral 04/19/2016   stereo w/ Dr. Bary Castilla, benign  . BREAST EXCISIONAL BIOPSY Right 04/19/2016   w/Dr. Bary Castilla, benign   . BREAST LUMPECTOMY Right 05/03/2016   ATYPICAL DUCTAL HYPERPLASIA (ADH) WITH MICROCALCIFICATIONS/: BREAST LUMPECTOMY; Ellwood Sayers, MD;  Location: [facility] OR[facility];  Service: General;  Laterality: Right;  . BREAST LUMPECTOMY WITH NEEDLE LOCALIZATION Right 05/03/2016   Procedure: BREAST LUMPECTOMY WITH NEEDLE LOCALIZATION;  Surgeon: Christene Lye, MD;  Location: [facility];  Service: General;  Laterality: Right;  . CHOLECYSTECTOMY    . COLONOSCOPY  2010, 2015   [facility]  . COLONOSCOPY  12/2013  . COLONOSCOPY WITH PROPOFOL N/A 07/17/2018   Procedure: COLONOSCOPY WITH PROPOFOL;  Surgeon: Manya Silvas, MD;  Location: [facility] END[facility];  Service: Endoscopy;  Laterality: N/A;  . HAMMER TOE SURGERY Right 06/07/2019   Procedure: HAMMER TOE CORRECTION RIGHT 2ND;  Surgeon: Albertine Patricia, DPM;  Location: [place];  Service: Podiatry;  Laterality: Right;  Diabetes - insulin  and oral meds  . OSTECTOMY Right 06/07/2019   Procedure: DOUBLE OSTEOTOMY RIGHT;  Surgeon: Albertine Patricia, DPM;  Location: [place];  Service: Podiatry;  Laterality: Right;  . WEIL OSTEOTOMY Right 06/07/2019   Procedure: WEIL OSTEOTOMY RIGHT 2ND;  Surgeon: Albertine Patricia, DPM;  Location: [place];  Service: Podiatry;  Laterality: Right;    Family History  Problem Relation Age of Onset  . Breast cancer Sister   . Breast cancer Daughter 36       [name]  . Heart attack Mother   . Heart attack Father   . Stomach cancer Brother   . Stomach cancer Sister   . Colon cancer Son 16    Social History:  reports that she has never smoked. She has never used smokeless tobacco. She reports that she does not drink alcohol and does not use drugs.  Allergies:  Allergies  Allergen Reactions  . Ciprofloxacin Hives    Medications reviewed.    ROS Full ROS performed and is otherwise negative other than what is stated in HPI   BP 119/74   Pulse 97   Temp 98.4 F (36.9 C)   Ht 5' 5.5" (1.664 m)   Wt 175 lb (79.4 kg)   SpO2 94%   BMI 28.68 kg/m   Physical Exam CONSTITUTIONAL: NAD LYMPH NODES:  Lymph nodes in the neck are normal. RESPIRATORY:  Lungs are clear. There is normal respiratory effort, with equal  breath sounds bilaterally, and without pathologic use of accessory muscles. CARDIOVASCULAR: Heart is regular without murmurs, gallops, or rubs. BREAST: previous lumpectomy scar on the Right , no new masses, no LAD, no DC or nipple changes. GI: The abdomen is  soft, nontender, and nondistended. There are no palpable masses. There is no hepatosplenomegaly. There are normal bowel sounds in all quadrants. GU: Rectal deferred.   MUSCULOSKELETAL: Normal muscle strength and tone. No cyanosis or edema.   SKIN: Turgor is good and there are no pathologic skin lesions or ulcers. NEUROLOGIC: Motor and sensation is grossly normal. Cranial nerves are grossly  intact. PSYCH:  Oriented to person, place and time. Affect is normal.   Assessment/Plan: 69 year old following up with normal breast exam and no evidence of worrisome lesions on mammogram, prior benign breast biopsy.  Recommend one-year follow-up with physical and mammogram. Greater than 50% of the 25 minutes  visit was spent in counseling/coordination of care   Caroleen Hamman, MD [place] Surgeon

## 2020-11-24 DIAGNOSIS — Z9889 Other specified postprocedural states: Secondary | ICD-10-CM | POA: Diagnosis not present

## 2020-11-24 DIAGNOSIS — M4316 Spondylolisthesis, lumbar region: Secondary | ICD-10-CM | POA: Diagnosis not present

## 2020-11-24 DIAGNOSIS — E1165 Type 2 diabetes mellitus with hyperglycemia: Secondary | ICD-10-CM | POA: Diagnosis not present

## 2020-11-24 DIAGNOSIS — R8290 Unspecified abnormal findings in urine: Secondary | ICD-10-CM | POA: Diagnosis not present

## 2020-11-24 DIAGNOSIS — Z794 Long term (current) use of insulin: Secondary | ICD-10-CM | POA: Diagnosis not present

## 2020-12-01 DIAGNOSIS — F411 Generalized anxiety disorder: Secondary | ICD-10-CM | POA: Diagnosis not present

## 2020-12-01 DIAGNOSIS — Z1159 Encounter for screening for other viral diseases: Secondary | ICD-10-CM | POA: Diagnosis not present

## 2020-12-01 DIAGNOSIS — E781 Pure hyperglyceridemia: Secondary | ICD-10-CM | POA: Diagnosis not present

## 2020-12-01 DIAGNOSIS — Z114 Encounter for screening for human immunodeficiency virus [HIV]: Secondary | ICD-10-CM | POA: Diagnosis not present

## 2020-12-01 DIAGNOSIS — Z0000 Encounter for general adult medical examination without abnormal findings: Secondary | ICD-10-CM | POA: Diagnosis not present

## 2020-12-01 DIAGNOSIS — Z794 Long term (current) use of insulin: Secondary | ICD-10-CM | POA: Diagnosis not present

## 2020-12-01 DIAGNOSIS — E1165 Type 2 diabetes mellitus with hyperglycemia: Secondary | ICD-10-CM | POA: Diagnosis not present

## 2020-12-01 DIAGNOSIS — Z9889 Other specified postprocedural states: Secondary | ICD-10-CM | POA: Diagnosis not present

## 2021-01-07 DIAGNOSIS — R079 Chest pain, unspecified: Secondary | ICD-10-CM | POA: Diagnosis not present

## 2021-03-26 DIAGNOSIS — Z0000 Encounter for general adult medical examination without abnormal findings: Secondary | ICD-10-CM | POA: Diagnosis not present

## 2021-03-26 DIAGNOSIS — E1165 Type 2 diabetes mellitus with hyperglycemia: Secondary | ICD-10-CM | POA: Diagnosis not present

## 2021-03-26 DIAGNOSIS — Z114 Encounter for screening for human immunodeficiency virus [HIV]: Secondary | ICD-10-CM | POA: Diagnosis not present

## 2021-03-26 DIAGNOSIS — Z1159 Encounter for screening for other viral diseases: Secondary | ICD-10-CM | POA: Diagnosis not present

## 2021-03-26 DIAGNOSIS — Z794 Long term (current) use of insulin: Secondary | ICD-10-CM | POA: Diagnosis not present

## 2021-04-02 DIAGNOSIS — F4321 Adjustment disorder with depressed mood: Secondary | ICD-10-CM | POA: Diagnosis not present

## 2021-04-02 DIAGNOSIS — K50813 Crohn's disease of both small and large intestine with fistula: Secondary | ICD-10-CM | POA: Diagnosis not present

## 2021-04-02 DIAGNOSIS — I10 Essential (primary) hypertension: Secondary | ICD-10-CM | POA: Insufficient documentation

## 2021-04-02 DIAGNOSIS — E1165 Type 2 diabetes mellitus with hyperglycemia: Secondary | ICD-10-CM | POA: Diagnosis not present

## 2021-04-02 DIAGNOSIS — M8580 Other specified disorders of bone density and structure, unspecified site: Secondary | ICD-10-CM | POA: Diagnosis not present

## 2021-04-02 DIAGNOSIS — Z0000 Encounter for general adult medical examination without abnormal findings: Secondary | ICD-10-CM | POA: Diagnosis not present

## 2021-04-02 DIAGNOSIS — Z794 Long term (current) use of insulin: Secondary | ICD-10-CM | POA: Diagnosis not present

## 2021-06-29 ENCOUNTER — Other Ambulatory Visit: Payer: Self-pay

## 2021-06-29 ENCOUNTER — Encounter: Payer: Self-pay | Admitting: Obstetrics and Gynecology

## 2021-06-29 ENCOUNTER — Ambulatory Visit (INDEPENDENT_AMBULATORY_CARE_PROVIDER_SITE_OTHER): Payer: PPO | Admitting: Obstetrics and Gynecology

## 2021-06-29 VITALS — BP 140/70 | Ht 65.5 in | Wt 181.2 lb

## 2021-06-29 DIAGNOSIS — Z0000 Encounter for general adult medical examination without abnormal findings: Secondary | ICD-10-CM

## 2021-06-29 DIAGNOSIS — Z13820 Encounter for screening for osteoporosis: Secondary | ICD-10-CM | POA: Diagnosis not present

## 2021-06-29 DIAGNOSIS — Z01419 Encounter for gynecological examination (general) (routine) without abnormal findings: Secondary | ICD-10-CM | POA: Diagnosis not present

## 2021-06-29 DIAGNOSIS — Z1211 Encounter for screening for malignant neoplasm of colon: Secondary | ICD-10-CM

## 2021-06-29 DIAGNOSIS — Z1231 Encounter for screening mammogram for malignant neoplasm of breast: Secondary | ICD-10-CM

## 2021-06-29 DIAGNOSIS — B3731 Acute candidiasis of vulva and vagina: Secondary | ICD-10-CM

## 2021-06-29 DIAGNOSIS — B373 Candidiasis of vulva and vagina: Secondary | ICD-10-CM | POA: Diagnosis not present

## 2021-06-29 DIAGNOSIS — L280 Lichen simplex chronicus: Secondary | ICD-10-CM

## 2021-06-29 DIAGNOSIS — R875 Abnormal microbiological findings in specimens from female genital organs: Secondary | ICD-10-CM

## 2021-06-29 MED ORDER — NYSTATIN 100000 UNIT/GM EX POWD: 1.0000 "application " | Freq: Three times a day (TID) | CUTANEOUS | 3 refills | Status: DC

## 2021-06-29 MED ORDER — CLOBETASOL PROPIONATE 0.05 % EX OINT: TOPICAL_OINTMENT | CUTANEOUS | 5 refills | Status: DC

## 2021-06-29 MED ORDER — FLUCONAZOLE 150 MG PO TABS: 150.0000 mg | ORAL_TABLET | ORAL | 0 refills | Status: AC

## 2021-06-29 NOTE — Progress Notes (Signed)
Gynecology Annual Exam  PCP: Tracie Harrier, MD  Chief Complaint:  Chief Complaint  Patient presents with   Gynecologic Exam    History of Present Illness: Patient is a 70 y.o. G2P2 presents for annual exam. The patient has no complaints today.   LMP: No LMP recorded. Patient is postmenopausal. She denies postmenopausal bleeding or spotting.  She has been having pain and itching of her bottom.  She feels like the skin is sensitive and fragile to the touch.  It sometimes feels like she is having tearing of her bottom.  The patient is not currently sexually active.    The patient does perform self breast exams.  There is notable family history of breast or ovarian cancer in her family.  The patient denies current symptoms of depression.    Review of Systems: Review of Systems  Constitutional:  Negative for chills, fever, malaise/fatigue and weight loss.  HENT:  Negative for congestion, hearing loss and sinus pain.   Eyes:  Negative for blurred vision and double vision.  Respiratory:  Negative for cough, sputum production, shortness of breath and wheezing.   Cardiovascular:  Negative for chest pain, palpitations, orthopnea and leg swelling.  Gastrointestinal:  Negative for abdominal pain, constipation, diarrhea, nausea and vomiting.  Genitourinary:  Negative for dysuria, flank pain, frequency, hematuria and urgency.  Musculoskeletal:  Negative for back pain, falls and joint pain.  Skin:  Negative for itching and rash.  Neurological:  Negative for dizziness and headaches.  Psychiatric/Behavioral:  Negative for depression, substance abuse and suicidal ideas. The patient is not nervous/anxious.    Past Medical History:  Past Medical History:  Diagnosis Date   Arthritis    lower back   Crohn's disease ([place])    Diabetes mellitus without complication ([place])    NIDDM   Diffuse cystic mastopathy    Dysrhythmia    Hypertension     Past Surgical History:  Past Surgical  History:  Procedure Laterality Date   BACK SURGERY  2019   BREAST BIOPSY Bilateral 04/19/2016   stereo w/ Dr. Bary Castilla, benign   BREAST EXCISIONAL BIOPSY Right 04/19/2016   w/Dr. Bary Castilla, benign    BREAST LUMPECTOMY Right 05/03/2016   ATYPICAL DUCTAL HYPERPLASIA (ADH) WITH MICROCALCIFICATIONS/: BREAST LUMPECTOMY; Ellwood Sayers, MD;  Location: [facility];  Service: General;  Laterality: Right;   BREAST LUMPECTOMY WITH NEEDLE LOCALIZATION Right 05/03/2016   Procedure: BREAST LUMPECTOMY WITH NEEDLE LOCALIZATION;  Surgeon: Christene Lye, MD;  Location: [facility];  Service: General;  Laterality: Right;   CHOLECYSTECTOMY     COLONOSCOPY  2010, 2015   [facility]   COLONOSCOPY  12/2013   COLONOSCOPY WITH PROPOFOL N/A 07/17/2018   Procedure: COLONOSCOPY WITH PROPOFOL;  Surgeon: Manya Silvas, MD;  Location: [facility];  Service: Endoscopy;  Laterality: N/A;   HAMMER TOE SURGERY Right 06/07/2019   Procedure: HAMMER TOE CORRECTION RIGHT 2ND;  Surgeon: Albertine Patricia, DPM;  Location: [place];  Service: Podiatry;  Laterality: Right;  Diabetes - insulin and oral meds   OSTECTOMY Right 06/07/2019   Procedure: DOUBLE OSTEOTOMY RIGHT;  Surgeon: Albertine Patricia, DPM;  Location: [place];  Service: Podiatry;  Laterality: Right;   WEIL OSTEOTOMY Right 06/07/2019   Procedure: WEIL OSTEOTOMY RIGHT 2ND;  Surgeon: Albertine Patricia, DPM;  Location: [place];  Service: Podiatry;  Laterality: Right;    Gynecologic History:  No LMP recorded. Patient is postmenopausal. Last Pap: unknown, denies history of prior abnormals Last mammogram: 2021 Results  were: BI-RAD I  Obstetric History: G2P2  Family History:  Family History  Problem Relation Age of Onset   Breast cancer Sister    Breast cancer Daughter 79       Renee Jones   Heart attack Mother    Heart attack Father    Stomach cancer Brother    Stomach cancer Sister    Colon cancer Son 64    Social History:  Social  History   Socioeconomic History   Marital status: Married    Spouse name: Not on file   Number of children: Not on file   Years of education: Not on file   Highest education level: Not on file  Occupational History   Not on file  Tobacco Use   Smoking status: Never   Smokeless tobacco: Never  Vaping Use   Vaping Use: Never used  Substance and Sexual Activity   Alcohol use: No   Drug use: No   Sexual activity: Not on file  Other Topics Concern   Not on file  Social History Narrative   Not on file   Social Determinants of Health   Financial Resource Strain: Not on file  Food Insecurity: Not on file  Transportation Needs: Not on file  Physical Activity: Not on file  Stress: Not on file  Social Connections: Not on file  Intimate Partner Violence: Not on file    Allergies:  Allergies  Allergen Reactions   Ciprofloxacin Hives    Medications: Prior to Admission medications   Medication Sig Start Date End Date Taking? Authorizing Provider  aspirin EC 81 MG tablet Take by mouth.   Yes [provider]  carvedilol (COREG) 3.125 MG tablet Take 3.125 mg by mouth 2 (two) times daily with a meal.   Yes [provider]  CINNAMON PO Take 1 tablet by mouth daily.   Yes [provider]  ergocalciferol (VITAMIN D2) 50000 units capsule Take 50,000 Units by mouth once a week.   Yes [provider]  GARLIC PO Take 1 tablet by mouth daily.   Yes [provider]  glipiZIDE (GLUCOTROL) 10 MG tablet Take 10 mg by mouth 2 (two) times daily. 11/06/17  Yes [provider]  insulin glargine (LANTUS) 100 UNIT/ML injection Inject 20 Units into the skin at bedtime.   Yes [provider]  metFORMIN (GLUCOPHAGE) 1000 MG tablet Take 1,000 mg by mouth 2 (two) times daily. 03/13/16  Yes [provider]  empagliflozin (JARDIANCE) 25 MG TABS tablet Take by mouth. Patient not taking: Reported on 06/29/2021 07/21/20 07/21/21  [provider]    Physical Exam Vitals: Blood pressure 140/70, height 5' 5.5" (1.664 m), weight 181 lb 3.2 oz (82.2 kg).  Physical Exam Constitutional:      Appearance: She is well-developed.  Genitourinary:     Genitourinary Comments: External: Indurated vuvla with whitening skin and slight erythema. No lesions noted.  Speculum examination: Normal appearing cervix. No blood in the vaginal vault. white discharge.   Bimanual examination: Uterus midline, non-tender, normal in size, shape and contour.  No CMT. No adnexal masses. No adnexal tenderness. Pelvis not fixed.  Breast Exam: breast equal without skin changes, nipple discharge, breast lump or enlarged lymph nodes      HENT:     Head: Normocephalic and atraumatic.  Neck:     Thyroid: No thyromegaly.  Cardiovascular:     Rate and Rhythm: Normal rate and regular rhythm.     Heart sounds: Normal  heart sounds.  Pulmonary:     Effort: Pulmonary effort is normal.     Breath sounds: Normal breath sounds.  Abdominal:     General: Bowel sounds are normal. There is no distension.     Palpations: Abdomen is soft. There is no mass.  Musculoskeletal:     Cervical back: Neck supple.  Neurological:     Mental Status: She is alert and oriented to person, place, and time.  Skin:    General: Skin is warm and dry.  Psychiatric:        Behavior: Behavior normal.        Thought Content: Thought content normal.        Judgment: Judgment normal.  Vitals reviewed.     Female chaperone present for pelvic and breast  portions of the physical exam  Assessment: 70 y.o. G2P2 routine annual exam  Plan: Problem List Items Addressed This Visit   None Visit Diagnoses     Encounter for annual routine gynecological examination    -  Primary   Health maintenance examination       Breast cancer screening by mammogram       Relevant Orders   MM 3D SCREEN BREAST BILATERAL   Encounter for gynecological examination without abnormal finding        Colon cancer screening       Relevant Orders   DG Bone Density   Screening for osteoporosis       Lichen simplex chronicus       Relevant Medications   clobetasol ointment (TEMOVATE) 0.05 %   Yeast vaginitis       Relevant Medications   fluconazole (DIFLUCAN) 150 MG tablet   nystatin (MYCOSTATIN/NYSTOP) powder   Other Relevant Orders   NuSwab BV and Candida, NAA   Abnormal microbiological finding in specimen from female genital organ       Relevant Orders   NuSwab BV and Candida, NAA       1) Mammogram - recommend yearly screening mammogram.  Mammogram Was ordered today  2) STI screening was offered and declined  3) Patient would like to discontinue pap smears.   4) Colonoscopy -up to date  5) Routine healthcare maintenance including cholesterol, diabetes screening discussed managed by PCP  6) Osteoporosis screening - ordered  7) Lichen simplex chronicus in setting of yeast vaginitis- will  start clobetasol, diflucan, and topical nystatin powder. Encouraged improved glucose control. Will follow up in 1 month.  Discussed and encouraged to break itch-scratch cycle. Provided with handout about lichen chronicus.   Adrian Prows MD, [facility], [facility] 06/30/2021 10:57 AM

## 2021-06-29 NOTE — Patient Instructions (Signed)
Institute of Medicine Recommended Dietary Allowances for Calcium and Vitamin D  Age (yr) Calcium Recommended Dietary Allowance (mg/day) Vitamin D Recommended Dietary Allowance (international units/day)  9-18 1,300 600  19-50 1,000 600  51-70 1,200 600  71 and older 1,200 800  Data from Institute of Medicine. Dietary reference intakes: calcium, vitamin D. [place], [place]: National Academies Press; 2011.   Exercising to Stay Healthy To become healthy and stay healthy, it is recommended that you do moderate-intensity and vigorous-intensity exercise. You can tell that you are exercising at a moderate intensity if your heart starts beating faster and you start breathing faster but can still hold a conversation. You can tell that you are exercising at a vigorous intensity if you are breathing much harder andfaster and cannot hold a conversation while exercising. Exercising regularly is important. It has many health benefits, such as: Improving overall fitness, flexibility, and endurance. Increasing bone density. Helping with weight control. Decreasing body fat. Increasing muscle strength. Reducing stress and tension. Improving overall health. How often should I exercise? Choose an activity that you enjoy, and set realistic goals. Your health careprovider can help you make an activity plan that works for you. Exercise regularly as told by your health care provider. This may include: Doing strength training two times a week, such as: Lifting weights. Using resistance bands. Push-ups. Sit-ups. Yoga. Doing a certain intensity of exercise for a given amount of time. Choose from these options: A total of 150 minutes of moderate-intensity exercise every week. A total of 75 minutes of vigorous-intensity exercise every week. A mix of moderate-intensity and vigorous-intensity exercise every week. Children, pregnant women, people who have not exercised regularly, people who are overweight, and  older adults may need to talk with a health care provider about what activities are safe to do. If you have a medical condition, be sureto talk with your health care provider before you start a new exercise program. What are some exercise ideas? Moderate-intensity exercise ideas include: Walking 1 mile (1.6 km) in about 15 minutes. Biking. Hiking. Golfing. Dancing. Water aerobics. Vigorous-intensity exercise ideas include: Walking 4.5 miles (7.2 km) or more in about 1 hour. Jogging or running 5 miles (8 km) in about 1 hour. Biking 10 miles (16.1 km) or more in about 1 hour. Lap swimming. Roller-skating or in-line skating. Cross-country skiing. Vigorous competitive sports, such as football, basketball, and soccer. Jumping rope. Aerobic dancing. What are some everyday activities that can help me to get exercise? Yard work, such as: Pushing a lawn mower. Raking and bagging leaves. Washing your car. Pushing a stroller. Shoveling snow. Gardening. Washing windows or floors. How can I be more active in my day-to-day activities? Use stairs instead of an elevator. Take a walk during your lunch break. If you drive, park your car farther away from your work or school. If you take public transportation, get off one stop early and walk the rest of the way. Stand up or walk around during all of your indoor phone calls. Get up, stretch, and walk around every 30 minutes throughout the day. Enjoy exercise with a friend. Support to continue exercising will help you keep a regular routine of activity. What guidelines can I follow while exercising? Before you start a new exercise program, talk with your health care provider. Do not exercise so much that you hurt yourself, feel dizzy, or get very short of breath. Wear comfortable clothes and wear shoes with good support. Drink plenty of water while you exercise to prevent   dehydration or heat stroke. Work out until your breathing and your  heartbeat get faster. Where to find more information U.S. Department of Health and Human Services: www.hhs.gov Centers for Disease Control and Prevention (CDC): www.cdc.gov Summary Exercising regularly is important. It will improve your overall fitness, flexibility, and endurance. Regular exercise also will improve your overall health. It can help you control your weight, reduce stress, and improve your bone density. Do not exercise so much that you hurt yourself, feel dizzy, or get very short of breath. Before you start a new exercise program, talk with your health care provider. This information is not intended to replace advice given to you by your health care provider. Make sure you discuss any questions you have with your healthcare provider. Document Revised: 11/14/2020 Document Reviewed: 11/14/2020 Elsevier Patient Education  2022 Elsevier Inc. Budget-Friendly Healthy Eating There are many ways to save money at the grocery store and continue to eat healthy. You can be successful if you: Plan meals according to your budget. Make a grocery list and only purchase food according to your grocery list. Prepare food yourself at home. What are tips for following this plan? Reading food labels Compare food labels between brand name foods and the store brand. Often the nutritional value is the same, but the store brand is lower cost. Look for products that do not have added sugar, fat, or salt (sodium). These often cost the same but are healthier for you. Products may be labeled as: Sugar-free. Nonfat. Low-fat. Sodium-free. Low-sodium. Look for lean ground beef labeled as at least 92% lean and 8% fat. Shopping  Buy only the items on your grocery list and go only to the areas of the store that have the items on your list. Use coupons only for foods and brands you normally buy. Avoid buying items you wouldn't normally buy simply because they are on sale. Check online and in newspapers for  weekly deals. Buy healthy items from the bulk bins when available, such as herbs, spices, flour, pasta, nuts, and dried fruit. Buy fruits and vegetables that are in season. Prices are usually lower on in-season produce. Look at the unit price on the price tag. Use it to compare different brands and sizes to find out which item is the best deal. Choose healthy items that are often low-cost, such as carrots, potatoes, apples, bananas, and oranges. Dried or canned beans are a low-cost protein source. Buy in bulk and freeze extra food. Items you can buy in bulk include meats, fish, poultry, frozen fruits, and frozen vegetables. Avoid buying "ready-to-eat" foods, such as pre-cut fruits and vegetables and pre-made salads. If possible, shop around to discover where you can find the best prices. Consider other retailers such as dollar stores, larger wholesale stores, local fruit and vegetable stands, and farmers markets. Do not shop when you are hungry. If you shop while hungry, it may be hard to stick to your list and budget. Resist impulse buying. Use your grocery list as your official plan for the week. Buy a variety of vegetables and fruits by purchasing fresh, frozen, and canned items. Look at the top and bottom shelves for deals. Foods at eye level (eye level of an adult or child) are usually more expensive. Be efficient with your time when shopping. The more time you spend at the store, the more money you are likely to spend. To save money when choosing more expensive foods like meats and dairy: Choose cheaper cuts of meat, such as bone-in   chicken thighs and drumsticks instead of skinless and boneless chicken. When you are ready to prepare the chicken, you can remove the skin yourself to make it healthier. Choose lean meats like chicken or turkey instead of beef. Choose canned seafood, such as tuna, salmon, or sardines. Buy eggs as a low-cost source of protein. Buy dried beans and peas, such as  lentils, split peas, or kidney beans instead of meats. Dried beans and peas are a good alternative source of protein. Buy the larger tubs of yogurt instead of individual-sized containers. Choose water instead of sodas and other sweetened beverages. Avoid buying chips, cookies, and other "junk food." These items are usually expensive and not healthy.  Cooking Make extra food and freeze the extras in meal-sized containers or in individual portions for fast meals and snacks. Pre-cook on days when you have extra time to prepare meals in advance. You can keep these meals in the fridge or freezer and reheat for a quick meal. When you come home from the grocery store, wash, peel, and cut fruits and vegetables so they are ready to use and eat. This will help reduce food waste. Meal planning Do not eat out or get fast food. Prepare food at home. Make a grocery list and make sure to bring it with you to the store. If you have a smart phone, you could use your phone to create your shopping list. Plan meals and snacks according to a grocery list and budget you create. Use leftovers in your meal plan for the week. Look for recipes where you can cook once and make enough food for two meals. Prepare budget-friendly types of meals like stews, casseroles, and stir-fry dishes. Try some meatless meals or try "no cook" meals like salads. Make sure that half your plate is filled with fruits or vegetables. Choose from fresh, frozen, or canned fruits and vegetables. If eating canned, remember to rinse them before eating. This will remove any excess salt added for packaging. Summary Eating healthy on a budget is possible if you plan your meals according to your budget, purchase according to your budget and grocery list, and prepare food yourself. Tips for buying more food on a limited budget include buying generic brands, using coupons only for foods you normally buy, and buying healthy items from the bulk bins when  available. Tips for buying cheaper food to replace expensive food include choosing cheaper, lean cuts of meat, and buying dried beans and peas. This information is not intended to replace advice given to you by your health care provider. Make sure you discuss any questions you have with your healthcare provider. Document Revised: 09/11/2020 Document Reviewed: 09/11/2020 Elsevier Patient Education  2022 Elsevier Inc. Bone Health Bones protect organs, store calcium, anchor muscles, and support the whole body. Keeping your bones strong is important, especially as you get older. Youcan take actions to help keep your bones strong and healthy. Why is keeping my bones healthy important?  Keeping your bones healthy is important because your body constantly replaces bone cells. Cells get old, and new cells take their place. As we age, we lose bone cells because the body may not be able to make enough new cells to replace the old cells. The amount of bone cells and bone tissue you have is referred toas bone mass. The higher your bone mass, the stronger your bones. The aging process leads to an overall loss of bone mass in the body, which can increase the likelihood of: Joint pain   and stiffness. Broken bones. A condition in which the bones become weak and brittle (osteoporosis). A large decline in bone mass occurs in older adults. In women, it occurs aboutthe time of menopause. What actions can I take to keep my bones healthy? Good health habits are important for maintaining healthy bones. This includes eating nutritious foods and exercising regularly. To have healthy bones, you need to get enough of the right minerals and vitamins. Most nutrition experts recommend getting these nutrients from the foods that you eat. In some cases, taking supplements may also be recommended. Doing certain types of exercise isalso important for bone health. What are the nutritional recommendations for healthy bones?  Eating  a well-balanced diet with plenty of calcium and vitamin D will help to protect your bones. Nutritional recommendations vary from person to person. Ask your health care provider what is healthy for you. Here are some generalguidelines. Get enough calcium Calcium is the most important (essential) mineral for bone health. Most people can get enough calcium from their diet, but supplements may be recommended for people who are at risk for osteoporosis. Good sources of calcium include: Dairy products, such as low-fat or nonfat milk, cheese, and yogurt. Dark green leafy vegetables, such as bok choy and broccoli. Calcium-fortified foods, such as orange juice, cereal, bread, soy beverages, and tofu products. Nuts, such as almonds. Follow these recommended amounts for daily calcium intake: Children, age 1-3: 700 mg. Children, age 4-8: 1,000 mg. Children, age 9-13: 1,300 mg. Teens, age 14-18: 1,300 mg. Adults, age 19-50: 1,000 mg. Adults, age 51-70: Men: 1,000 mg. Women: 1,200 mg. Adults, age 71 or older: 1,200 mg. Pregnant and breastfeeding females: Teens: 1,300 mg. Adults: 1,000 mg. Get enough vitamin D Vitamin D is the most essential vitamin for bone health. It helps the body absorb calcium. Sunlight stimulates the skin to make vitamin D, so be sure to get enough sunlight. If you live in a cold climate or you do not get outside often, your health care provider may recommend that you take vitamin D supplements. Good sources of vitamin D in your diet include: Egg yolks. Saltwater fish. Milk and cereal fortified with vitamin D. Follow these recommended amounts for daily vitamin D intake: Children and teens, age 1-18: 600 international units. Adults, age 50 or younger: 400-800 international units. Adults, age 51 or older: 800-1,000 international units. Get other important nutrients Other nutrients that are important for bone health include: Phosphorus. This mineral is found in meat, poultry,  dairy foods, nuts, and legumes. The recommended daily intake for adult men and adult women is 700 mg. Magnesium. This mineral is found in seeds, nuts, dark green vegetables, and legumes. The recommended daily intake for adult men is 400-420 mg. For adult women, it is 310-320 mg. Vitamin K. This vitamin is found in green leafy vegetables. The recommended daily intake is 120 mg for adult men and 90 mg for adult women. What type of physical activity is best for building and maintaining healthybones? Weight-bearing and strength-building activities are important for building and maintaining healthy bones. Weight-bearing activities cause muscles and bones to work against gravity. Strength-building activities increase the strength of the muscles that support bones. Weight-bearing and muscle-building activities include: Walking and hiking. Jogging and running. Dancing. Gym exercises. Lifting weights. Tennis and racquetball. Climbing stairs. Aerobics. Adults should get at least 30 minutes of moderate physical activity on most days. Children should get at least 60 minutes of moderate physical activity onmost days. Ask your health care   provider what type of exercise is best for you. How can I find out if my bone mass is low? Bone mass can be measured with an X-ray test called a bone mineral density (BMD) test. This test is recommended for all women who are age 65 or older. It may also be recommended for: Men who are age 70 or older. People who are at risk for osteoporosis because of: Having bones that break easily. Having a long-term disease that weakens bones, such as kidney disease or rheumatoid arthritis. Having menopause earlier than normal. Taking medicine that weakens bones, such as steroids, thyroid hormones, or hormone treatment for breast cancer or prostate cancer. Smoking. Drinking three or more alcoholic drinks a day. If you find that you have a low bone mass, you may be able to  preventosteoporosis or further bone loss by changing your diet and lifestyle. Where can I find more information? For more information, check out the following websites: National Osteoporosis Foundation: www.nof.org/patients National Institutes of Health: www.bones.nih.gov International Osteoporosis Foundation: www.iofbonehealth.org Summary The aging process leads to an overall loss of bone mass in the body, which can increase the likelihood of broken bones and osteoporosis. Eating a well-balanced diet with plenty of calcium and vitamin D will help to protect your bones. Weight-bearing and strength-building activities are also important for building and maintaining strong bones. Bone mass can be measured with an X-ray test called a bone mineral density (BMD) test. This information is not intended to replace advice given to you by your health care provider. Make sure you discuss any questions you have with your healthcare provider. Document Revised: 12/26/2017 Document Reviewed: 12/26/2017 Elsevier Patient Education  2022 Elsevier Inc.  

## 2021-07-04 LAB — NUSWAB BV AND CANDIDA, NAA
Candida albicans, NAA: NEGATIVE
Candida glabrata, NAA: NEGATIVE

## 2021-07-20 DIAGNOSIS — H353131 Nonexudative age-related macular degeneration, bilateral, early dry stage: Secondary | ICD-10-CM | POA: Diagnosis not present

## 2021-07-27 ENCOUNTER — Other Ambulatory Visit: Payer: Self-pay

## 2021-07-27 ENCOUNTER — Ambulatory Visit: Payer: PPO | Admitting: Obstetrics and Gynecology

## 2021-07-27 ENCOUNTER — Encounter: Payer: Self-pay | Admitting: Obstetrics and Gynecology

## 2021-07-27 VITALS — BP 120/70 | Ht 65.0 in | Wt 180.0 lb

## 2021-07-27 DIAGNOSIS — L280 Lichen simplex chronicus: Secondary | ICD-10-CM | POA: Diagnosis not present

## 2021-07-27 DIAGNOSIS — B3731 Acute candidiasis of vulva and vagina: Secondary | ICD-10-CM

## 2021-07-27 DIAGNOSIS — B373 Candidiasis of vulva and vagina: Secondary | ICD-10-CM | POA: Diagnosis not present

## 2021-07-27 NOTE — Progress Notes (Signed)
Patient ID: Dorothy Murray, female   DOB: [birth year], 70 y.o.   MRN: [id number]  Reason for Consult: Gynecologic Exam   Referred by Tracie Harrier, MD  Subjective:     HPI:  Dorothy Murray is a 70 y.o. female she reports that she has had significant improvement in the pain and discomfort of her bottom.  She took the Diflucan and has been using the nystatin powder.  She has significantly less itching.  She has been applying the clobetasol.  She tries for nightly application although sometimes she forgets.  Gynecological History  No LMP recorded. Patient is postmenopausal.  Past Medical History:  Diagnosis Date   Arthritis    lower back   Crohn's disease ([place])    Diabetes mellitus without complication ([place])    NIDDM   Diffuse cystic mastopathy    Dysrhythmia    Hypertension    Family History  Problem Relation Age of Onset   Breast cancer Sister    Breast cancer Daughter 48       Renee Jones   Heart attack Mother    Heart attack Father    Stomach cancer Brother    Stomach cancer Sister    Colon cancer Son 47   Past Surgical History:  Procedure Laterality Date   BACK SURGERY  2019   BREAST BIOPSY Bilateral 04/19/2016   stereo w/ Dr. Bary Castilla, benign   BREAST EXCISIONAL BIOPSY Right 04/19/2016   w/Dr. Bary Castilla, benign    BREAST LUMPECTOMY Right 05/03/2016   ATYPICAL DUCTAL HYPERPLASIA (ADH) WITH MICROCALCIFICATIONS/: BREAST LUMPECTOMY; Ellwood Sayers, MD;  Location: [facility] ORS;  Service: General;  Laterality: Right;   BREAST LUMPECTOMY WITH NEEDLE LOCALIZATION Right 05/03/2016   Procedure: BREAST LUMPECTOMY WITH NEEDLE LOCALIZATION;  Surgeon: Christene Lye, MD;  Location: [facility];  Service: General;  Laterality: Right;   CHOLECYSTECTOMY     COLONOSCOPY  2010, 2015   [facility]   COLONOSCOPY  12/2013   COLONOSCOPY WITH PROPOFOL N/A 07/17/2018   Procedure: COLONOSCOPY WITH PROPOFOL;  Surgeon: Manya Silvas, MD;  Location: [facility] ENDOSCOPY;  Service: Endoscopy;  Laterality:  N/A;   HAMMER TOE SURGERY Right 06/07/2019   Procedure: HAMMER TOE CORRECTION RIGHT 2ND;  Surgeon: Albertine Patricia, DPM;  Location: [place];  Service: Podiatry;  Laterality: Right;  Diabetes - insulin and oral meds   OSTECTOMY Right 06/07/2019   Procedure: DOUBLE OSTEOTOMY RIGHT;  Surgeon: Albertine Patricia, DPM;  Location: [place];  Service: Podiatry;  Laterality: Right;   WEIL OSTEOTOMY Right 06/07/2019   Procedure: WEIL OSTEOTOMY RIGHT 2ND;  Surgeon: Albertine Patricia, DPM;  Location: [place];  Service: Podiatry;  Laterality: Right;    Short Social History:  Social History   Tobacco Use   Smoking status: Never   Smokeless tobacco: Never  Substance Use Topics   Alcohol use: No    Allergies  Allergen Reactions   Ciprofloxacin Hives    Current Outpatient Medications  Medication Sig Dispense Refill   aspirin EC 81 MG tablet Take by mouth.     carvedilol (COREG) 3.125 MG tablet Take 3.125 mg by mouth 2 (two) times daily with a meal.     CINNAMON PO Take 1 tablet by mouth daily.     clobetasol ointment (TEMOVATE) 0.05 % Apply to affected area every night for 4 weeks, then every other day for 4 weeks and then twice a week for 4 weeks or until resolution. 30 g 5   ergocalciferol (VITAMIN D2)  50000 units capsule Take 50,000 Units by mouth once a week.     GARLIC PO Take 1 tablet by mouth daily.     glipiZIDE (GLUCOTROL) 10 MG tablet Take 10 mg by mouth 2 (two) times daily.  5   insulin glargine (LANTUS) 100 UNIT/ML injection Inject 20 Units into the skin at bedtime.     metFORMIN (GLUCOPHAGE) 1000 MG tablet Take 1,000 mg by mouth 2 (two) times daily.  5   nystatin (MYCOSTATIN/NYSTOP) powder Apply 1 application topically 3 (three) times daily. 60 g 3   No current facility-administered medications for this visit.    Review of Systems  Constitutional: Negative for chills, fatigue, fever and unexpected weight change.  HENT: Negative for trouble  swallowing.  Eyes: Negative for loss of vision.  Respiratory: Negative for cough, shortness of breath and wheezing.  Cardiovascular: Negative for chest pain, leg swelling, palpitations and syncope.  GI: Negative for abdominal pain, blood in stool, diarrhea, nausea and vomiting.  GU: Negative for difficulty urinating, dysuria, frequency and hematuria.  Musculoskeletal: Negative for back pain, leg pain and joint pain.  Skin: Negative for rash.  Neurological: Negative for dizziness, headaches, light-headedness, numbness and seizures.  Psychiatric: Negative for behavioral problem, confusion, depressed mood and sleep disturbance.       Objective:  Objective   Vitals:   07/27/21 1553  BP: 120/70  Weight: 180 lb (81.6 kg)  Height: 5' 5"  (1.651 m)   Body mass index is 29.95 kg/m.  Physical Exam Vitals and nursing note reviewed. Exam conducted with a chaperone present.  Constitutional:      Appearance: Normal appearance. She is well-developed.  HENT:     Head: Normocephalic and atraumatic.  Eyes:     Extraocular Movements: Extraocular movements intact.     Pupils: Pupils are equal, round, and reactive to light.  Cardiovascular:     Rate and Rhythm: Normal rate and regular rhythm.  Pulmonary:     Effort: Pulmonary effort is normal. No respiratory distress.     Breath sounds: Normal breath sounds.  Abdominal:     General: Abdomen is flat.     Palpations: Abdomen is soft.  Genitourinary:   Musculoskeletal:        General: No signs of injury.  Skin:    General: Skin is warm and dry.  Neurological:     Mental Status: She is alert and oriented to person, place, and time.  Psychiatric:        Behavior: Behavior normal.        Thought Content: Thought content normal.        Judgment: Judgment normal.     Assessment/Plan:     70 year old with lichen sclerosis and yeast vaginitis 1 continued areas of whitening.  Recommended continuing nightly or every other night application  of clobetasol ointment for the next 3 months.  We will follow-up to reassess with the patient at that time. 2.  Continue to use topical nystatin powder as needed for yeast.  Recommended application 1-2 times a week for suppression.  More than 20 minutes were spent face to face with the patient in the room, reviewing the medical record, labs and images, and coordinating care for the patient. The plan of management was discussed in detail and counseling was provided.       Adrian Prows MD [facility], [facility] 07/27/2021 4:22 PM

## 2021-07-30 DIAGNOSIS — M8580 Other specified disorders of bone density and structure, unspecified site: Secondary | ICD-10-CM | POA: Diagnosis not present

## 2021-07-30 DIAGNOSIS — K50813 Crohn's disease of both small and large intestine with fistula: Secondary | ICD-10-CM | POA: Diagnosis not present

## 2021-07-30 DIAGNOSIS — Z794 Long term (current) use of insulin: Secondary | ICD-10-CM | POA: Diagnosis not present

## 2021-07-30 DIAGNOSIS — E1165 Type 2 diabetes mellitus with hyperglycemia: Secondary | ICD-10-CM | POA: Diagnosis not present

## 2021-07-30 DIAGNOSIS — I10 Essential (primary) hypertension: Secondary | ICD-10-CM | POA: Diagnosis not present

## 2021-07-30 DIAGNOSIS — F4321 Adjustment disorder with depressed mood: Secondary | ICD-10-CM | POA: Diagnosis not present

## 2021-08-06 DIAGNOSIS — E1165 Type 2 diabetes mellitus with hyperglycemia: Secondary | ICD-10-CM | POA: Diagnosis not present

## 2021-08-06 DIAGNOSIS — Z6829 Body mass index (BMI) 29.0-29.9, adult: Secondary | ICD-10-CM | POA: Diagnosis not present

## 2021-08-06 DIAGNOSIS — M8580 Other specified disorders of bone density and structure, unspecified site: Secondary | ICD-10-CM | POA: Diagnosis not present

## 2021-08-06 DIAGNOSIS — K50813 Crohn's disease of both small and large intestine with fistula: Secondary | ICD-10-CM | POA: Diagnosis not present

## 2021-08-06 DIAGNOSIS — Z794 Long term (current) use of insulin: Secondary | ICD-10-CM | POA: Diagnosis not present

## 2021-08-06 DIAGNOSIS — I10 Essential (primary) hypertension: Secondary | ICD-10-CM | POA: Diagnosis not present

## 2021-08-06 DIAGNOSIS — R7989 Other specified abnormal findings of blood chemistry: Secondary | ICD-10-CM | POA: Diagnosis not present

## 2021-08-10 ENCOUNTER — Ambulatory Visit
Admission: RE | Admit: 2021-08-10 | Discharge: 2021-08-10 | Disposition: A | Discharge status: Home / Self Care | Payer: PPO | Source: Ambulatory Visit | Attending: Obstetrics and Gynecology | Admitting: Obstetrics and Gynecology

## 2021-08-10 ENCOUNTER — Other Ambulatory Visit: Payer: Self-pay

## 2021-08-10 DIAGNOSIS — Z1231 Encounter for screening mammogram for malignant neoplasm of breast: Secondary | ICD-10-CM | POA: Diagnosis not present

## 2021-08-31 ENCOUNTER — Ambulatory Visit: Payer: PPO | Admitting: Surgery

## 2021-09-07 ENCOUNTER — Encounter: Payer: Self-pay | Admitting: Surgery

## 2021-09-07 ENCOUNTER — Ambulatory Visit: Payer: PPO | Admitting: Surgery

## 2021-09-07 ENCOUNTER — Other Ambulatory Visit: Payer: Self-pay

## 2021-09-07 VITALS — BP 112/76 | HR 94 | Temp 98.7°F | Ht 65.5 in | Wt 175.4 lb

## 2021-09-07 DIAGNOSIS — E1169 Type 2 diabetes mellitus with other specified complication: Secondary | ICD-10-CM | POA: Insufficient documentation

## 2021-09-07 DIAGNOSIS — E669 Obesity, unspecified: Secondary | ICD-10-CM | POA: Insufficient documentation

## 2021-09-07 DIAGNOSIS — N6091 Unspecified benign mammary dysplasia of right breast: Secondary | ICD-10-CM | POA: Diagnosis not present

## 2021-09-07 NOTE — Patient Instructions (Signed)
Our office will contact you august 2023 to schedule your mammogram and follow up appointment with Dr.Pabon.     Breast Self-Awareness Breast self-awareness means being familiar with how your breasts look and feel. It involves checking your breasts regularly and reporting any changes to your health care provider. Practicing breast self-awareness is important. Sometimes changes may not be harmful (are benign), but sometimes a change in your breasts can be a sign of a serious medical problem. It is important to learn how to do this procedure correctly so that you can catch problems early, when treatment is more likely to be successful. All women should practice breast self-awareness, including women who have had breast implants. What you need: A mirror. A well-lit room. How to do a breast self-exam A breast self-exam is one way to learn what is normal for your breasts and whether your breasts are changing. To do a breast self-exam: Look for changes  Remove all the clothing above your waist. Stand in front of a mirror in a room with good lighting. Put your hands on your hips. Push your hands firmly downward. Compare your breasts in the mirror. Look for differences between them (asymmetry), such as: Differences in shape. Differences in size. Puckers, dips, and bumps in one breast and not the other. Look at each breast for changes in the skin, such as: Redness. Scaly areas. Look for changes in your nipples, such as: Discharge. Bleeding. Dimpling. Redness. A change in position. Feel for changes Carefully feel your breasts for lumps and changes. It is best to do this while lying on your back on the floor, and again while sitting or standing in the tub or shower with soapy water on your skin. Feel each breast in the following way: Place the arm on the side of the breast you are examining above your head. Feel your breast with the other hand. Start in the nipple area and make -inch (2 cm)  overlapping circles to feel your breast. Use the pads of your three middle fingers to do this. Apply light pressure, then medium pressure, then firm pressure. The light pressure will allow you to feel the tissue closest to the skin. The medium pressure will allow you to feel the tissue that is a little deeper. The firm pressure will allow you to feel the tissue close to the ribs. Continue the overlapping circles, moving downward over the breast until you feel your ribs below your breast. Move one finger-width toward the center of the body. Continue to use the -inch (2 cm) overlapping circles to feel your breast as you move slowly up toward your collarbone. Continue the up-and-down exam using all three pressures until you reach your armpit.  Write down what you find Writing down what you find can help you remember what to discuss with your health care provider. Write down: What is normal for each breast. Any changes that you find in each breast, including: The kind of changes you find. Any pain or tenderness. Size and location of any lumps. Where you are in your menstrual cycle, if you are still menstruating. General tips and recommendations Examine your breasts every month. If you are breastfeeding, the best time to examine your breasts is after a feeding or after using a breast pump. If you menstruate, the best time to examine your breasts is 5-7 days after your period. Breasts are generally lumpier during menstrual periods, and it may be more difficult to notice changes. With time and practice, you will become  more familiar with the variations in your breasts and more comfortable with the exam. Contact a health care provider if you: See a change in the shape or size of your breasts or nipples. See a change in the skin of your breast or nipples, such as a reddened or scaly area. Have unusual discharge from your nipples. Find a lump or thick area that was not there before. Have pain in your  breasts. Have any concerns related to your breast health. Summary Breast self-awareness includes looking for physical changes in your breasts, as well as feeling for any changes within your breasts. Breast self-awareness should be performed in front of a mirror in a well-lit room. You should examine your breasts every month. If you menstruate, the best time to examine your breasts is 5-7 days after your menstrual period. Let your health care provider know of any changes you notice in your breasts, including changes in size, changes on the skin, pain or tenderness, or unusual fluid from your nipples. This information is not intended to replace advice given to you by your health care provider. Make sure you discuss any questions you have with your health care provider. Document Revised: 07/18/2018 Document Reviewed: 07/18/2018 Elsevier Patient Education  Dorothy Murray.

## 2021-09-14 NOTE — Progress Notes (Signed)
Outpatient Surgical Follow Up  09/14/2021  Dorothy Murray is an 70 y.o. female.   Chief Complaint  Patient presents with   Follow-up    1 yr mammo    HPI: Dorothy Murray is a 70 y.o. female with a history of atypical ductal hyperplasia status postRight  lumpectomy by Dr. Jamal Collin on May 2017 no dysplasia.  She is following up after having a mammogram that I have personally reviewed showing no evidence of new concerning lesions.  She also denies any new lumps bumps or nipple discharge.  No weight loss no fevers no chills.  She does self breast exams.   Past Medical History:  Diagnosis Date   Arthritis    lower back   Crohn's disease ([place])    Diabetes mellitus without complication ([place])    NIDDM   Diffuse cystic mastopathy    Dysrhythmia    Hypertension     Past Surgical History:  Procedure Laterality Date   BACK SURGERY  2019   BREAST BIOPSY Bilateral 04/19/2016   stereo w/ Dr. Bary Castilla, benign   BREAST EXCISIONAL BIOPSY Right 04/19/2016   w/Dr. Bary Castilla, benign    BREAST LUMPECTOMY Right 05/03/2016   ATYPICAL DUCTAL HYPERPLASIA (ADH) WITH MICROCALCIFICATIONS/: BREAST LUMPECTOMY; Ellwood Sayers, MD;  Location: [facility] ORS;  Service: General;  Laterality: Right;   BREAST LUMPECTOMY WITH NEEDLE LOCALIZATION Right 05/03/2016   Procedure: BREAST LUMPECTOMY WITH NEEDLE LOCALIZATION;  Surgeon: Christene Lye, MD;  Location: [facility];  Service: General;  Laterality: Right;   CHOLECYSTECTOMY     COLONOSCOPY  2010, 2015   [facility]   COLONOSCOPY  12/2013   COLONOSCOPY WITH PROPOFOL N/A 07/17/2018   Procedure: COLONOSCOPY WITH PROPOFOL;  Surgeon: Manya Silvas, MD;  Location: [facility] ENDOSCOPY;  Service: Endoscopy;  Laterality: N/A;   HAMMER TOE SURGERY Right 06/07/2019   Procedure: HAMMER TOE CORRECTION RIGHT 2ND;  Surgeon: Albertine Patricia, DPM;  Location: [place];  Service: Podiatry;  Laterality: Right;  Diabetes - insulin and oral meds   OSTECTOMY Right 06/07/2019   Procedure:  DOUBLE OSTEOTOMY RIGHT;  Surgeon: Albertine Patricia, DPM;  Location: [place];  Service: Podiatry;  Laterality: Right;   WEIL OSTEOTOMY Right 06/07/2019   Procedure: WEIL OSTEOTOMY RIGHT 2ND;  Surgeon: Albertine Patricia, DPM;  Location: [place];  Service: Podiatry;  Laterality: Right;    Family History  Problem Relation Age of Onset   Heart attack Mother    Heart attack Father    Breast cancer Sister    Stomach cancer Sister    Stomach cancer Brother    Breast cancer Daughter 82       Franchot Heidelberg   Colon cancer Son 66    Social History:  reports that she has never smoked. She has never used smokeless tobacco. She reports that she does not drink alcohol and does not use drugs.  Allergies:  Allergies  Allergen Reactions   Ciprofloxacin Hives    Medications reviewed.    ROS Full ROS performed and is otherwise negative other than what is stated in HPI   BP 112/76   Pulse 94   Temp 98.7 F (37.1 C) (Oral)   Ht 5' 5.5" (1.664 m)   Wt 175 lb 6.4 oz (79.6 kg)   SpO2 93%   BMI 28.74 kg/m   Physical Exam CONSTITUTIONAL: NAD EYES: Pupils are equal, round, and reactive to light, Sclera are non-icteric. LYMPH NODES:  Lymph nodes in the neck are normal. RESPIRATORY:  Lungs are clear. There is normal respiratory effort, with equal breath sounds bilaterally, and without pathologic use of accessory muscles. CARDIOVASCULAR: Heart is regular without murmurs, gallops, or rubs. BREAST: previous Right lumpectomy scar, no new masses, no LAD, no DC or nipple changes. GI: The abdomen is  soft, nontender, and nondistended. There are no palpable masses. There is no hepatosplenomegaly. There are normal bowel sounds in all quadrants. GU: Rectal deferred.   MUSCULOSKELETAL: Normal muscle strength and tone. No cyanosis or edema.   SKIN: Turgor is good and there are no pathologic skin lesions or ulcers. NEUROLOGIC: Motor and sensation is grossly normal. Cranial nerves are  grossly intact. PSYCH:  Oriented to person, place and time. Affect is normal.  Assessment/Plan:  70 year old Hx of ADH following up with normal breast exam and no evidence of worrisome lesions on mammogram.  Recommend one-year follow-up with physical and mammogram.   Greater than 50% of the 30 minutes  visit was spent in counseling/coordination of care   Caroleen Hamman, MD [place] [facility]

## 2021-10-26 ENCOUNTER — Ambulatory Visit: Payer: PPO | Admitting: Obstetrics and Gynecology

## 2021-11-30 DIAGNOSIS — Z03818 Encounter for observation for suspected exposure to other biological agents ruled out: Secondary | ICD-10-CM | POA: Diagnosis not present

## 2021-11-30 DIAGNOSIS — J101 Influenza due to other identified influenza virus with other respiratory manifestations: Secondary | ICD-10-CM | POA: Diagnosis not present

## 2021-12-21 ENCOUNTER — Ambulatory Visit: Payer: PPO | Admitting: Obstetrics and Gynecology

## 2022-01-07 DIAGNOSIS — Z6829 Body mass index (BMI) 29.0-29.9, adult: Secondary | ICD-10-CM | POA: Diagnosis not present

## 2022-01-07 DIAGNOSIS — E1165 Type 2 diabetes mellitus with hyperglycemia: Secondary | ICD-10-CM | POA: Diagnosis not present

## 2022-01-07 DIAGNOSIS — M8580 Other specified disorders of bone density and structure, unspecified site: Secondary | ICD-10-CM | POA: Diagnosis not present

## 2022-01-07 DIAGNOSIS — I10 Essential (primary) hypertension: Secondary | ICD-10-CM | POA: Diagnosis not present

## 2022-01-07 DIAGNOSIS — Z794 Long term (current) use of insulin: Secondary | ICD-10-CM | POA: Diagnosis not present

## 2022-01-07 DIAGNOSIS — K50813 Crohn's disease of both small and large intestine with fistula: Secondary | ICD-10-CM | POA: Diagnosis not present

## 2022-01-07 DIAGNOSIS — R7989 Other specified abnormal findings of blood chemistry: Secondary | ICD-10-CM | POA: Diagnosis not present

## 2022-01-14 DIAGNOSIS — E1165 Type 2 diabetes mellitus with hyperglycemia: Secondary | ICD-10-CM | POA: Diagnosis not present

## 2022-01-14 DIAGNOSIS — Z1331 Encounter for screening for depression: Secondary | ICD-10-CM | POA: Diagnosis not present

## 2022-01-14 DIAGNOSIS — M8580 Other specified disorders of bone density and structure, unspecified site: Secondary | ICD-10-CM | POA: Diagnosis not present

## 2022-01-14 DIAGNOSIS — Z0000 Encounter for general adult medical examination without abnormal findings: Secondary | ICD-10-CM | POA: Diagnosis not present

## 2022-01-14 DIAGNOSIS — K5090 Crohn's disease, unspecified, without complications: Secondary | ICD-10-CM | POA: Diagnosis not present

## 2022-01-14 DIAGNOSIS — Z794 Long term (current) use of insulin: Secondary | ICD-10-CM | POA: Diagnosis not present

## 2022-01-14 DIAGNOSIS — E119 Type 2 diabetes mellitus without complications: Secondary | ICD-10-CM | POA: Diagnosis not present

## 2022-01-14 DIAGNOSIS — I10 Essential (primary) hypertension: Secondary | ICD-10-CM | POA: Diagnosis not present

## 2022-01-18 DIAGNOSIS — H353131 Nonexudative age-related macular degeneration, bilateral, early dry stage: Secondary | ICD-10-CM | POA: Diagnosis not present

## 2022-03-01 ENCOUNTER — Ambulatory Visit: Payer: Self-pay | Admitting: Obstetrics and Gynecology

## 2022-04-26 ENCOUNTER — Ambulatory Visit: Payer: Medicare HMO | Admitting: Obstetrics and Gynecology

## 2022-07-05 DIAGNOSIS — K5080 Crohn's disease of both small and large intestine without complications: Secondary | ICD-10-CM | POA: Diagnosis not present

## 2022-07-05 DIAGNOSIS — M8580 Other specified disorders of bone density and structure, unspecified site: Secondary | ICD-10-CM | POA: Diagnosis not present

## 2022-07-05 DIAGNOSIS — I10 Essential (primary) hypertension: Secondary | ICD-10-CM | POA: Diagnosis not present

## 2022-07-05 DIAGNOSIS — E1165 Type 2 diabetes mellitus with hyperglycemia: Secondary | ICD-10-CM | POA: Diagnosis not present

## 2022-07-05 DIAGNOSIS — Z794 Long term (current) use of insulin: Secondary | ICD-10-CM | POA: Diagnosis not present

## 2022-07-05 DIAGNOSIS — Z9889 Other specified postprocedural states: Secondary | ICD-10-CM | POA: Diagnosis not present

## 2022-07-12 DIAGNOSIS — M4316 Spondylolisthesis, lumbar region: Secondary | ICD-10-CM | POA: Diagnosis not present

## 2022-07-12 DIAGNOSIS — I10 Essential (primary) hypertension: Secondary | ICD-10-CM | POA: Diagnosis not present

## 2022-07-12 DIAGNOSIS — K5090 Crohn's disease, unspecified, without complications: Secondary | ICD-10-CM | POA: Diagnosis not present

## 2022-07-12 DIAGNOSIS — E781 Pure hyperglyceridemia: Secondary | ICD-10-CM | POA: Diagnosis not present

## 2022-07-12 DIAGNOSIS — Z0000 Encounter for general adult medical examination without abnormal findings: Secondary | ICD-10-CM | POA: Diagnosis not present

## 2022-07-12 DIAGNOSIS — Z1389 Encounter for screening for other disorder: Secondary | ICD-10-CM | POA: Diagnosis not present

## 2022-07-12 DIAGNOSIS — E119 Type 2 diabetes mellitus without complications: Secondary | ICD-10-CM | POA: Diagnosis not present

## 2022-07-12 DIAGNOSIS — Z794 Long term (current) use of insulin: Secondary | ICD-10-CM | POA: Diagnosis not present

## 2022-07-13 ENCOUNTER — Other Ambulatory Visit: Payer: Self-pay

## 2022-07-13 DIAGNOSIS — Z1231 Encounter for screening mammogram for malignant neoplasm of breast: Secondary | ICD-10-CM

## 2022-08-12 DIAGNOSIS — Z794 Long term (current) use of insulin: Secondary | ICD-10-CM | POA: Diagnosis not present

## 2022-08-12 DIAGNOSIS — E1165 Type 2 diabetes mellitus with hyperglycemia: Secondary | ICD-10-CM | POA: Diagnosis not present

## 2022-08-12 DIAGNOSIS — I10 Essential (primary) hypertension: Secondary | ICD-10-CM | POA: Diagnosis not present

## 2022-08-12 DIAGNOSIS — E781 Pure hyperglyceridemia: Secondary | ICD-10-CM | POA: Diagnosis not present

## 2022-08-12 DIAGNOSIS — M4316 Spondylolisthesis, lumbar region: Secondary | ICD-10-CM | POA: Diagnosis not present

## 2022-08-17 ENCOUNTER — Telehealth: Payer: Self-pay

## 2022-08-17 NOTE — Telephone Encounter (Signed)
Returned fax: Not Authorized with note: Patient needs appointment. Last seen 07/27/21. Last Annual 06/29/21.

## 2022-08-17 NOTE — Telephone Encounter (Signed)
Fax refill request received from [facility] for: clobetasol ointment (TEMOVATE) 0.05 % Last filled: 03/21/22 QTY:30g

## 2022-08-23 ENCOUNTER — Ambulatory Visit: Payer: Medicare HMO | Admitting: Surgery

## 2022-08-30 ENCOUNTER — Ambulatory Visit: Payer: Medicare HMO | Admitting: Surgery

## 2022-08-30 DIAGNOSIS — E669 Obesity, unspecified: Secondary | ICD-10-CM | POA: Diagnosis not present

## 2022-08-30 DIAGNOSIS — E1169 Type 2 diabetes mellitus with other specified complication: Secondary | ICD-10-CM | POA: Diagnosis not present

## 2022-08-30 DIAGNOSIS — Z794 Long term (current) use of insulin: Secondary | ICD-10-CM | POA: Diagnosis not present

## 2022-08-30 DIAGNOSIS — E1165 Type 2 diabetes mellitus with hyperglycemia: Secondary | ICD-10-CM | POA: Diagnosis not present

## 2022-09-06 ENCOUNTER — Ambulatory Visit: Payer: Medicare HMO | Admitting: Surgery

## 2022-09-09 ENCOUNTER — Ambulatory Visit
Admission: RE | Admit: 2022-09-09 | Discharge: 2022-09-09 | Disposition: A | Discharge status: Home / Self Care | Payer: Medicare HMO | Source: Ambulatory Visit | Attending: Surgery | Admitting: Surgery

## 2022-09-09 ENCOUNTER — Other Ambulatory Visit: Payer: Self-pay | Admitting: Surgery

## 2022-09-09 DIAGNOSIS — Z1231 Encounter for screening mammogram for malignant neoplasm of breast: Secondary | ICD-10-CM | POA: Insufficient documentation

## 2022-09-09 DIAGNOSIS — N6489 Other specified disorders of breast: Secondary | ICD-10-CM

## 2022-09-09 DIAGNOSIS — R928 Other abnormal and inconclusive findings on diagnostic imaging of breast: Secondary | ICD-10-CM

## 2022-09-23 ENCOUNTER — Telehealth: Payer: Self-pay

## 2022-09-23 NOTE — Patient Outreach (Signed)
  Care Coordination   09/23/2022 Name: Dorothy Murray MRN: [id number] DOB: 05/20/1951   Care Coordination Outreach Attempts:  An unsuccessful telephone outreach was attempted today to offer the patient information about available care coordination services as a benefit of their health plan.   Follow Up Plan:  Additional outreach attempts will be made to offer the patient care coordination information and services.   Encounter Outcome:  No Answer  Care Coordination Interventions Activated:  No   Care Coordination Interventions:  No, not indicated    Noreene Larsson RN, MSN, [facility]  Mobile: [phone]

## 2022-09-27 ENCOUNTER — Ambulatory Visit: Payer: Self-pay

## 2022-09-27 ENCOUNTER — Ambulatory Visit: Payer: Medicare HMO | Admitting: Surgery

## 2022-09-27 NOTE — Patient Instructions (Signed)
Visit Information  Thank you for taking time to visit with me today. Please don't hesitate to contact me if I can be of assistance to you.   Following are the goals we discussed today:   Goals Addressed             This Visit's Progress    COMPLETED: RNCM: Help with Ozempic       Care Coordination Interventions: Reviewed medications with patient and discussed importance of medication adherence Referral made to pharmacy team for assistance with Ozempic needs. The patient was calling thinking she had reached the number for the pharm D.  Screening for signs and symptoms of depression related to chronic disease state  Assessed social determinant of health barriers An email was sent to Birdena Crandall, Pharm D at [facility] . Also provided information to the patient on how to reach the pharm D at: [phone] and [phone].             Please call the care guide team at [phone] if you need to schedule an appointment.   If you are experiencing a Mental Health or [place] or need someone to talk to, please call the Suicide and Crisis Lifeline: 988 call the [place] National Suicide Prevention Lifeline: (301)602-3228 or TTY: 3853739594 TTY (780)223-6937) to talk to a trained counselor call 1-800-273-TALK (toll free, 24 hour hotline)  The patient verbalized understanding of instructions, educational materials, and care plan provided today and DECLINED offer to receive copy of patient instructions, educational materials, and care plan.   No further follow up required: the patient needs pharm D support and the information provided to the patient on how to reach the pharm D and an email sent to the pharm D letting the pharm D know the patient needed to reach her.  Noreene Larsson RN, MSN, [facility]  Mobile: [phone]

## 2022-09-27 NOTE — Patient Outreach (Signed)
  Care Coordination   Initial Visit Note   09/27/2022 Name: Dorothy Murray MRN: [id number] DOB: 1951-11-18  Dorothy Murray is a 71 y.o. year old female who sees Tracie Harrier, MD for primary care. I spoke with  Dorothy Murray by phone today.  What matters to the patients health and wellness today?  Needing to speak to the pharm D concerning help with Ozempic     Goals Addressed             This Visit's Progress    COMPLETED: RNCM: Help with Ozempic       Care Coordination Interventions: Reviewed medications with patient and discussed importance of medication adherence Referral made to pharmacy team for assistance with Ozempic needs. The patient was calling thinking she had reached the number for the pharm D.  Screening for signs and symptoms of depression related to chronic disease state  Assessed social determinant of health barriers An email was sent to Birdena Crandall, Pharm D at [facility] . Also provided information to the patient on how to reach the pharm D at: [phone] and [phone].           SDOH assessments and interventions completed:  Yes  SDOH Interventions Today    Flowsheet Row Most Recent Value  SDOH Interventions   Food Insecurity Interventions Intervention Not Indicated  Housing Interventions Intervention Not Indicated  Transportation Interventions Intervention Not Indicated  Utilities Interventions Intervention Not Indicated  Financial Strain Interventions Other (Comment)  [needs assistance for help with Ozempic]        Care Coordination Interventions Activated:  Yes  Care Coordination Interventions:  Yes, provided   Follow up plan: No further intervention required.   Encounter Outcome:  Pt. Visit Completed   Noreene Larsson RN, MSN, [place]  Mobile: [phone]

## 2022-09-30 ENCOUNTER — Ambulatory Visit
Admission: RE | Admit: 2022-09-30 | Discharge: 2022-09-30 | Disposition: A | Discharge status: Home / Self Care | Payer: Medicare HMO | Source: Ambulatory Visit | Attending: Surgery | Admitting: Surgery

## 2022-09-30 DIAGNOSIS — R928 Other abnormal and inconclusive findings on diagnostic imaging of breast: Secondary | ICD-10-CM

## 2022-09-30 DIAGNOSIS — N6489 Other specified disorders of breast: Secondary | ICD-10-CM | POA: Insufficient documentation

## 2022-09-30 DIAGNOSIS — R92312 Mammographic fatty tissue density, left breast: Secondary | ICD-10-CM | POA: Diagnosis not present

## 2022-10-04 ENCOUNTER — Ambulatory Visit: Payer: Medicare HMO | Admitting: Surgery

## 2022-10-04 ENCOUNTER — Encounter: Payer: Self-pay | Admitting: Surgery

## 2022-10-04 VITALS — BP 174/83 | HR 83 | Temp 98.7°F | Wt 173.4 lb

## 2022-10-04 DIAGNOSIS — R928 Other abnormal and inconclusive findings on diagnostic imaging of breast: Secondary | ICD-10-CM

## 2022-10-04 DIAGNOSIS — N6091 Unspecified benign mammary dysplasia of right breast: Secondary | ICD-10-CM

## 2022-10-04 NOTE — Patient Instructions (Signed)
If you have any concerns or questions, please feel free to call our office.   Breast Self-Awareness Breast self-awareness is knowing how your breasts look and feel. You need to: Check your breasts on a regular basis. Tell your doctor about any changes. Become familiar with the look and feel of your breasts. This can help you catch a breast problem while it is still small and can be treated. You should do breast self-exams even if you have breast implants. What you need: A mirror. A well-lit room. A pillow or other soft object. How to do a breast self-exam Follow these steps to do a breast self-exam: Look for changes  Take off all the clothes above your waist. Stand in front of a mirror in a room with good lighting. Put your hands down at your sides. Compare your breasts in the mirror. Look for any difference between them, such as: A difference in shape. A difference in size. Wrinkles, dips, and bumps in one breast and not the other. Look at each breast for changes in the skin, such as: Redness. Scaly areas. Skin that has gotten thicker. Dimpling. Open sores (ulcers). Look for changes in your nipples, such as: Fluid coming out of a nipple. Fluid around a nipple. Bleeding. Dimpling. Redness. A nipple that looks pushed in (retracted), or that has changed position. Feel for changes Lie on your back. Feel each breast. To do this: Pick a breast to feel. Place a pillow under the shoulder closest to that breast. Put the arm closest to that breast behind your head. Feel the nipple area of that breast using the hand of your other arm. Feel the area with the pads of your three middle fingers by making small circles with your fingers. Use light, medium, and firm pressure. Continue the overlapping circles, moving downward over the breast. Keep making circles with your fingers. Stop when you feel your ribs. Start making circles with your fingers again, this time going upward until you  reach your collarbone. Then, make circles outward across your breast and into your armpit area. Squeeze your nipple. Check for discharge and lumps. Repeat these steps to check your other breast. Sit or stand in the tub or shower. With soapy water on your skin, feel each breast the same way you did when you were lying down. Write down what you find Writing down what you find can help you remember what to tell your doctor. Write down: What is normal for each breast. Any changes you find in each breast. These include: The kind of changes you find. A tender or painful breast. Any lump you find. Write down its size and where it is. When you last had your monthly period (menstrual cycle). General tips If you are breastfeeding, the best time to check your breasts is after you feed your baby or after you use a breast pump. If you get monthly bleeding, the best time to check your breasts is 5-7 days after your monthly cycle ends. With time, you will become comfortable with the self-exam. You will also start to know if there are changes in your breasts. Contact a doctor if: You see a change in the shape or size of your breasts or nipples. You see a change in the skin of your breast or nipples, such as red or scaly skin. You have fluid coming from your nipples that is not normal. You find a new lump or thick area. You have breast pain. You have any concerns about your  breast health. Summary Breast self-awareness includes looking for changes in your breasts and feeling for changes within your breasts. You should do breast self-awareness in front of a mirror in a well-lit room. If you get monthly periods (menstrual cycles), the best time to check your breasts is 5-7 days after your period ends. Tell your doctor about any changes you see in your breasts. Changes include changes in size, changes on the skin, painful or tender breasts, or fluid from your nipples that is not normal. This information is  not intended to replace advice given to you by your health care provider. Make sure you discuss any questions you have with your health care provider. Document Revised: 10/01/2021 Document Reviewed: 10/01/2021 Elsevier Patient Education  [place].

## 2022-10-04 NOTE — Progress Notes (Signed)
Outpatient Surgical Follow Up  10/04/2022  Dorothy Murray is an 71 y.o. female.   Chief Complaint  Patient presents with   Follow-up    1 year f/u mammo 09/09/22 and 09/30/22    HPI: Dorothy Murray is a 71 y.o. female with a history of atypical ductal hyperplasia status postRight  lumpectomy by Dr. Jamal Collin on May 2017 no dysplasia.  She is following up after having a mammogram and U/S that I have personally reviewed showing  likely benign asymmetry on mammo on the Left , further u/s failed to detect a suspicious lesion. No definitive suspicious masses.  She also denies any new lumps bumps or nipple discharge.  No weight loss no fevers no chills.  She does self breast exams.    Past Medical History:  Diagnosis Date   Arthritis    lower back   Crohn's disease ([place])    Diabetes mellitus without complication ([place])    NIDDM   Diffuse cystic mastopathy    Dysrhythmia    Hypertension     Past Surgical History:  Procedure Laterality Date   BACK SURGERY  2019   BREAST BIOPSY Bilateral 04/19/2016   stereo w/ Dr. Bary Castilla, benign   BREAST EXCISIONAL BIOPSY Right 04/19/2016   w/Dr. Bary Castilla, benign    BREAST LUMPECTOMY Right 05/03/2016   ATYPICAL DUCTAL HYPERPLASIA (ADH) WITH MICROCALCIFICATIONS/: BREAST LUMPECTOMY; Ellwood Sayers, MD;  Location: [facility] ORS;  Service: General;  Laterality: Right;   BREAST LUMPECTOMY WITH NEEDLE LOCALIZATION Right 05/03/2016   Procedure: BREAST LUMPECTOMY WITH NEEDLE LOCALIZATION;  Surgeon: Christene Lye, MD;  Location: [facility] ORS;  Service: General;  Laterality: Right;   CHOLECYSTECTOMY     COLONOSCOPY  2010, 2015   [facility]   COLONOSCOPY  12/2013   COLONOSCOPY WITH PROPOFOL N/A 07/17/2018   Procedure: COLONOSCOPY WITH PROPOFOL;  Surgeon: Manya Silvas, MD;  Location: [facility] ENDOSCOPY;  Service: Endoscopy;  Laterality: N/A;   HAMMER TOE SURGERY Right 06/07/2019   Procedure: HAMMER TOE CORRECTION RIGHT 2ND;  Surgeon: Albertine Patricia, DPM;  Location: [place];  Service: Podiatry;  Laterality: Right;  Diabetes - insulin and oral meds   OSTECTOMY Right 06/07/2019   Procedure: DOUBLE OSTEOTOMY RIGHT;  Surgeon: Albertine Patricia, DPM;  Location: [place];  Service: Podiatry;  Laterality: Right;   WEIL OSTEOTOMY Right 06/07/2019   Procedure: WEIL OSTEOTOMY RIGHT 2ND;  Surgeon: Albertine Patricia, DPM;  Location: [place];  Service: Podiatry;  Laterality: Right;    Family History  Problem Relation Age of Onset   Heart attack Mother    Heart attack Father    Breast cancer Sister    Stomach cancer Sister    Stomach cancer Brother    Breast cancer Daughter 72       Franchot Heidelberg   Colon cancer Son 37    Social History:  reports that she has never smoked. She has never used smokeless tobacco. She reports that she does not drink alcohol and does not use drugs.  Allergies:  Allergies  Allergen Reactions   Ciprofloxacin Hives    Medications reviewed.    ROS Full ROS performed and is otherwise negative other than what is stated in HPI   BP (!) 174/83   Pulse 83   Temp 98.7 F (37.1 C) (Oral)   Wt 173 lb 6.4 oz (78.7 kg)   SpO2 98%   BMI 28.42 kg/m   Physical Exam Chaperone present CONSTITUTIONAL: NAD EYES: Pupils are equal, round,  and reactive to light, Sclera are non-icteric. LYMPH NODES:  Lymph nodes in the neck are normal. RESPIRATORY:  Lungs are clear. There is normal respiratory effort, with equal breath sounds bilaterally, and without pathologic use of accessory muscles. CARDIOVASCULAR: Heart is regular without murmurs, gallops, or rubs. BREAST: previous Right lumpectomy scar, no new masses, no LAD, no DC or nipple changes. GI: The abdomen is  soft, nontender, and nondistended. There are no palpable masses. There is no hepatosplenomegaly. There are normal bowel sounds in all quadrants. GU: Rectal deferred.   MUSCULOSKELETAL: Normal muscle strength and tone. No cyanosis or edema.   SKIN: Turgor is  good and there are no pathologic skin lesions or ulcers. NEUROLOGIC: Motor and sensation is grossly normal. Cranial nerves are grossly intact. PSYCH:  Oriented to person, place and time. Affect is normal  Assessment/Plan:  71 year old Hx of ADH following up with likely benign asymmetry on mammo on the Left   Recommend 6 months follow-up with physical and mammogram.  No need for biopsies or further test at this time Please note that I have spent 30 minutes in this encounter including personally reviewing imaging studies, coordinating care, placing orders, performing appropriate documentation.   Caroleen Hamman, MD [facility] General Surgeon

## 2022-11-01 ENCOUNTER — Ambulatory Visit: Payer: Medicare HMO | Admitting: Dermatology

## 2022-11-01 DIAGNOSIS — L820 Inflamed seborrheic keratosis: Secondary | ICD-10-CM | POA: Diagnosis not present

## 2022-11-01 DIAGNOSIS — L821 Other seborrheic keratosis: Secondary | ICD-10-CM

## 2022-11-01 DIAGNOSIS — L814 Other melanin hyperpigmentation: Secondary | ICD-10-CM | POA: Diagnosis not present

## 2022-11-01 DIAGNOSIS — L918 Other hypertrophic disorders of the skin: Secondary | ICD-10-CM | POA: Diagnosis not present

## 2022-11-01 DIAGNOSIS — L578 Other skin changes due to chronic exposure to nonionizing radiation: Secondary | ICD-10-CM

## 2022-11-01 NOTE — Patient Instructions (Addendum)
Cryotherapy Aftercare  Wash gently with soap and water everyday.   Apply Vaseline and Band-Aid daily until healed.   Seborrheic Keratosis  What causes seborrheic keratoses? Seborrheic keratoses are harmless, common skin growths that first appear during adult life.  As time goes by, more growths appear.  Some people may develop a large number of them.  Seborrheic keratoses appear on both covered and uncovered body parts.  They are not caused by sunlight.  The tendency to develop seborrheic keratoses can be inherited.  They vary in color from skin-colored to gray, brown, or even black.  They can be either smooth or have a rough, warty surface.   Seborrheic keratoses are superficial and look as if they were stuck on the skin.  Under the microscope this type of keratosis looks like layers upon layers of skin.  That is why at times the top layer may seem to fall off, but the rest of the growth remains and re-grows.    Treatment Seborrheic keratoses do not need to be treated, but can easily be removed in the office.  Seborrheic keratoses often cause symptoms when they rub on clothing or jewelry.  Lesions can be in the way of shaving.  If they become inflamed, they can cause itching, soreness, or burning.  Removal of a seborrheic keratosis can be accomplished by freezing, burning, or surgery. If any spot bleeds, scabs, or grows rapidly, please return to have it checked, as these can be an indication of a skin cancer.   Melanoma ABCDEs  Melanoma is the most dangerous type of skin cancer, and is the leading cause of death from skin disease.  You are more likely to develop melanoma if you: Have light-colored skin, light-colored eyes, or red or blond hair Spend a lot of time in the sun Tan regularly, either outdoors or in a tanning bed Have had blistering sunburns, especially during childhood Have a close family member who has had a melanoma Have atypical moles or large birthmarks  Early detection of  melanoma is key since treatment is typically straightforward and cure rates are extremely high if we catch it early.   The first sign of melanoma is often a change in a mole or a new dark spot.  The ABCDE system is a way of remembering the signs of melanoma.  A for asymmetry:  The two halves do not match. B for border:  The edges of the growth are irregular. C for color:  A mixture of colors are present instead of an even brown color. D for diameter:  Melanomas are usually (but not always) greater than 22m - the size of a pencil eraser. E for evolution:  The spot keeps changing in size, shape, and color.  Please check your skin once per month between visits. You can use a small mirror in front and a large mirror behind you to keep an eye on the back side or your body.   If you see any new or changing lesions before your next follow-up, please call to schedule a visit.  Please continue daily skin protection including broad spectrum sunscreen SPF 30+ to sun-exposed areas, reapplying every 2 hours as needed when you're outdoors.    Due to recent changes in healthcare laws, you may see results of your pathology and/or laboratory studies on MyChart before the doctors have had a chance to review them. We understand that in some cases there may be results that are confusing or concerning to you. Please understand that  not all results are received at the same time and often the doctors may need to interpret multiple results in order to provide you with the best plan of care or course of treatment. Therefore, we ask that you please give [place] 2 business days to thoroughly review all your results before contacting the office for clarification. Should we see a critical lab result, you will be contacted sooner.   If You Need Anything After Your Visit  If you have any questions or concerns for your doctor, please call our main line at [phone] and press option 4 to reach your doctor's medical assistant. If  no one answers, please leave a voicemail as directed and we will return your call as soon as possible. Messages left after 4 pm will be answered the following business day.   You may also send [place] a message via [place]. We typically respond to MyChart messages within 1-2 business days.  For prescription refills, please ask your pharmacy to contact our office. Our fax number is [phone].  If you have an urgent issue when the clinic is closed that cannot wait until the next business day, you can page your doctor at the number below.    Please note that while we do our best to be available for urgent issues outside of office hours, we are not available 24/7.   If you have an urgent issue and are unable to reach [place], you may choose to seek medical care at your doctor's office, retail clinic, urgent care center, or emergency room.  If you have a medical emergency, please immediately call 911 or go to the emergency department.  Pager Numbers  - Dr. Nehemiah Massed: [phone]  - Dr. Laurence Ferrari: [phone]  - Dr. Nicole Kindred: [phone]  In the event of inclement weather, please call our main line at [phone] for an update on the status of any delays or closures.  Dermatology Medication Tips: Please keep the boxes that topical medications come in in order to help keep track of the instructions about where and how to use these. Pharmacies typically print the medication instructions only on the boxes and not directly on the medication tubes.   If your medication is too expensive, please contact our office at [phone] option 4 or send [place] a message through [place].   We are unable to tell what your co-pay for medications will be in advance as this is different depending on your insurance coverage. However, we may be able to find a substitute medication at lower cost or fill out paperwork to get insurance to cover a needed medication.   If a prior authorization is required to get your medication  covered by your insurance company, please allow [place] 1-2 business days to complete this process.  Drug prices often vary depending on where the prescription is filled and some pharmacies may offer cheaper prices.  The website www.goodrx.com contains coupons for medications through different pharmacies. The prices here do not account for what the cost may be with help from insurance (it may be cheaper with your insurance), but the website can give you the price if you did not use any insurance.  - You can print the associated coupon and take it with your prescription to the pharmacy.  - You may also stop by our office during regular business hours and pick up a GoodRx coupon card.  - If you need your prescription sent electronically to a different pharmacy, notify our office through [facility] or by phone  at [phone] option 4.     Si Usted Necesita Algo Despus de Su Visita  Tambin puede enviarnos un mensaje a travs de Pharmacist, community. Por lo general respondemos a los mensajes de MyChart en el transcurso de 1 a 2 das hbiles.  Para renovar recetas, por favor pida a su farmacia que se ponga en contacto con nuestra oficina. Harland Dingwall de fax es [place] [phone].  Si tiene un asunto urgente cuando la clnica est cerrada y que no puede esperar hasta el siguiente da hbil, puede llamar/localizar a su doctor(a) al nmero que aparece a continuacin.   Por favor, tenga en cuenta que aunque hacemos todo lo posible para estar disponibles para asuntos urgentes fuera del horario de [place], no estamos disponibles las 24 horas del da, los 7 das de la [place].   Si tiene un problema urgente y no puede comunicarse con nosotros, puede optar por buscar atencin mdica  en el consultorio de su doctor(a), en una clnica privada, en un centro de atencin urgente o en una sala de emergencias.  Si tiene Engineering geologist, por favor llame inmediatamente al 911 o vaya a la sala de  emergencias.  Nmeros de bper  - Dr. Nehemiah Massed: [phone]  - Dra. Moye: [phone]  - Dra. Nicole Kindred: [phone]  En caso de inclemencias del [place], por favor llame a Johnsie Kindred principal al [phone] para una actualizacin sobre el [place] de cualquier retraso o cierre.  Consejos para la medicacin en dermatologa: Por favor, guarde las cajas en las que vienen los medicamentos de uso tpico para ayudarle a seguir las instrucciones sobre dnde y cmo usarlos. Las farmacias generalmente imprimen las instrucciones del medicamento slo en las cajas y no directamente en los tubos del [place].   Si su medicamento es muy caro, por favor, pngase en contacto con Zigmund Daniel llamando al [phone] y presione la opcin 4 o envenos un mensaje a travs de Pharmacist, community.   No podemos decirle cul ser su copago por los medicamentos por adelantado ya que esto es diferente dependiendo de la cobertura de su seguro. Sin embargo, es posible que podamos encontrar un medicamento sustituto a Electrical engineer un formulario para que el seguro cubra el medicamento que se considera necesario.   Si se requiere una autorizacin previa para que su compaa de seguros [place] su medicamento, por favor permtanos de 1 a 2 das hbiles para completar este proceso.  Los precios de los medicamentos varan con frecuencia dependiendo del Environmental consultant de dnde se surte la receta y alguna farmacias pueden ofrecer precios ms baratos.  El sitio web www.goodrx.com tiene cupones para medicamentos de Airline pilot. Los precios aqu no tienen en cuenta lo que podra costar con la ayuda del seguro (puede ser ms barato con su seguro), pero el sitio web puede darle el precio si no utiliz Research scientist (physical sciences).  - Puede imprimir el cupn correspondiente y llevarlo con su receta a la farmacia.  - Tambin puede pasar por nuestra oficina durante el horario de atencin regular y Charity fundraiser una tarjeta de cupones de GoodRx.  - Si  necesita que su receta se enve electrnicamente a una farmacia diferente, informe a nuestra oficina a travs de MyChart de [place] o por telfono llamando al [phone] y presione la opcin 4.

## 2022-11-01 NOTE — Progress Notes (Signed)
   New Patient Visit  Subjective  Dorothy Murray is a 71 y.o. female who presents for the following: Skin Problem (Patient here today to have a few irritated places at face and chest removed. No personal or family hx of skin cancer. ).  Spot on chest itches.   The following portions of the chart were reviewed this encounter and updated as appropriate:       Review of Systems:  No other skin or systemic complaints except as noted in HPI or Assessment and Plan.  Objective  Well appearing patient in no apparent distress; mood and affect are within normal limits.  A focused examination was performed including face, chest. Relevant physical exam findings are noted in the Assessment and Plan.  Right Breast Erythematous stuck-on, waxy papule  Left Sternum 1.6 cm waxy hypopigmented pink patch     Assessment & Plan  Inflamed seborrheic keratosis Right Breast  Symptomatic, irritating, patient would like treated.   Destruction of lesion - Right Breast  Destruction method: cryotherapy   Informed consent: discussed and consent obtained   Lesion destroyed using liquid nitrogen: Yes   Region frozen until ice ball extended beyond lesion: Yes   Outcome: patient tolerated procedure well with no complications   Post-procedure details: wound care instructions given   Additional details:  Prior to procedure, discussed risks of blister formation, small wound, skin dyspigmentation, or rare scar following cryotherapy. Recommend Vaseline ointment to treated areas while healing.   Seborrheic keratosis Left Sternum  Benign-appearing.  Observation.  Call clinic for new or changing lesions.  Recommend daily use of broad spectrum spf 30+ sunscreen to sun-exposed areas.     Seborrheic Keratoses - Stuck-on, waxy, tan-brown macules face - Benign-appearing - Discussed benign etiology and prognosis. - Observe, discussed cosmetic cryotherapy - Call for any changes  Acrochordons (Skin Tags) -  Fleshy, skin-colored pedunculated papules - Benign appearing.  - Observe. - If desired, they can be removed with an in office procedure that is not covered by insurance. - Please call the clinic if you notice any new or changing lesions.  Actinic Damage - chronic, secondary to cumulative UV radiation exposure/sun exposure over time - diffuse scaly erythematous macules with underlying dyspigmentation - Recommend daily broad spectrum sunscreen SPF 30+ to sun-exposed areas, reapply every 2 hours as needed.  - Recommend staying in the shade or wearing long sleeves, sun glasses (UVA+UVB protection) and wide brim hats (4-inch brim around the entire circumference of the hat). - Call for new or changing lesions.  Lentigines - Scattered tan macules - Due to sun exposure - Benign-appearing, observe - Recommend daily broad spectrum sunscreen SPF 30+ to sun-exposed areas, reapply every 2 hours as needed. - Call for any changes  Return in about 7 weeks (around 12/20/2022) for treat cosmetic SK face.  Graciella Belton, RMA, am acting as scribe for Brendolyn Patty, MD .  Documentation: I have reviewed the above documentation for accuracy and completeness, and I agree with the above.  Brendolyn Patty MD

## 2022-11-11 DIAGNOSIS — E669 Obesity, unspecified: Secondary | ICD-10-CM | POA: Diagnosis not present

## 2022-11-11 DIAGNOSIS — E1169 Type 2 diabetes mellitus with other specified complication: Secondary | ICD-10-CM | POA: Diagnosis not present

## 2022-11-11 DIAGNOSIS — Z794 Long term (current) use of insulin: Secondary | ICD-10-CM | POA: Diagnosis not present

## 2022-11-11 DIAGNOSIS — E1165 Type 2 diabetes mellitus with hyperglycemia: Secondary | ICD-10-CM | POA: Diagnosis not present

## 2022-11-18 DIAGNOSIS — E1165 Type 2 diabetes mellitus with hyperglycemia: Secondary | ICD-10-CM | POA: Diagnosis not present

## 2022-11-22 DIAGNOSIS — M810 Age-related osteoporosis without current pathological fracture: Secondary | ICD-10-CM | POA: Diagnosis not present

## 2022-12-03 DIAGNOSIS — E1165 Type 2 diabetes mellitus with hyperglycemia: Secondary | ICD-10-CM | POA: Diagnosis not present

## 2022-12-27 ENCOUNTER — Ambulatory Visit: Payer: Medicare HMO | Admitting: Dermatology

## 2022-12-27 DIAGNOSIS — L820 Inflamed seborrheic keratosis: Secondary | ICD-10-CM | POA: Diagnosis not present

## 2022-12-27 DIAGNOSIS — L821 Other seborrheic keratosis: Secondary | ICD-10-CM

## 2022-12-27 NOTE — Patient Instructions (Addendum)
Cryotherapy Aftercare  Wash gently with soap and water everyday.   Apply Vaseline and Band-Aid daily until healed.   Discussed cosmetic procedure, noncovered.  $60 for 1st lesion and $15 for each additional lesion if done on the same day.  Maximum charge $350.  One touch-up treatment included no charge. Discussed risks of treatment including dyspigmentation, small scar, and/or recurrence. Recommend daily broad spectrum sunscreen SPF 30+/photoprotection to treated areas once healed.  Due to recent changes in healthcare laws, you may see results of your pathology and/or laboratory studies on MyChart before the doctors have had a chance to review them. We understand that in some cases there may be results that are confusing or concerning to you. Please understand that not all results are received at the same time and often the doctors may need to interpret multiple results in order to provide you with the best plan of care or course of treatment. Therefore, we ask that you please give [place] 2 business days to thoroughly review all your results before contacting the office for clarification. Should we see a critical lab result, you will be contacted sooner.   If You Need Anything After Your Visit  If you have any questions or concerns for your doctor, please call our main line at [phone] and press option 4 to reach your doctor's medical assistant. If no one answers, please leave a voicemail as directed and we will return your call as soon as possible. Messages left after 4 pm will be answered the following business day.   You may also send [place] a message via [place]. We typically respond to MyChart messages within 1-2 business days.  For prescription refills, please ask your pharmacy to contact our office. Our fax number is [phone].  If you have an urgent issue when the clinic is closed that cannot wait until the next business day, you can page your doctor at the number below.    Please note  that while we do our best to be available for urgent issues outside of office hours, we are not available 24/7.   If you have an urgent issue and are unable to reach [place], you may choose to seek medical care at your doctor's office, retail clinic, urgent care center, or emergency room.  If you have a medical emergency, please immediately call 911 or go to the emergency department.  Pager Numbers  - Dr. Nehemiah Massed: [phone]  - Dr. Laurence Ferrari: [phone]  - Dr. Nicole Kindred: [phone]  In the event of inclement weather, please call our main line at [phone] for an update on the status of any delays or closures.  Dermatology Medication Tips: Please keep the boxes that topical medications come in in order to help keep track of the instructions about where and how to use these. Pharmacies typically print the medication instructions only on the boxes and not directly on the medication tubes.   If your medication is too expensive, please contact our office at [phone] option 4 or send [place] a message through [place].   We are unable to tell what your co-pay for medications will be in advance as this is different depending on your insurance coverage. However, we may be able to find a substitute medication at lower cost or fill out paperwork to get insurance to cover a needed medication.   If a prior authorization is required to get your medication covered by your insurance company, please allow [place] 1-2 business days to complete this process.  Drug prices often  vary depending on where the prescription is filled and some pharmacies may offer cheaper prices.  The website www.goodrx.com contains coupons for medications through different pharmacies. The prices here do not account for what the cost may be with help from insurance (it may be cheaper with your insurance), but the website can give you the price if you did not use any insurance.  - You can print the associated coupon and take it with your  prescription to the pharmacy.  - You may also stop by our office during regular business hours and pick up a GoodRx coupon card.  - If you need your prescription sent electronically to a different pharmacy, notify our office through [facility] or by phone at [phone] option 4.     Si Usted Necesita Algo Despus de Su Visita  Tambin puede enviarnos un mensaje a travs de Pharmacist, community. Por lo general respondemos a los mensajes de MyChart en el transcurso de 1 a 2 das hbiles.  Para renovar recetas, por favor pida a su farmacia que se ponga en contacto con nuestra oficina. Harland Dingwall de fax es [place] [phone].  Si tiene un asunto urgente cuando la clnica est cerrada y que no puede esperar hasta el siguiente da hbil, puede llamar/localizar a su doctor(a) al nmero que aparece a continuacin.   Por favor, tenga en cuenta que aunque hacemos todo lo posible para estar disponibles para asuntos urgentes fuera del horario de [place], no estamos disponibles las 24 horas del da, los 7 das de la [place].   Si tiene un problema urgente y no puede comunicarse con nosotros, puede optar por buscar atencin mdica  en el consultorio de su doctor(a), en una clnica privada, en un centro de atencin urgente o en una sala de emergencias.  Si tiene Engineering geologist, por favor llame inmediatamente al 911 o vaya a la sala de emergencias.  Nmeros de bper  - Dr. Nehemiah Massed: [phone]  - Dra. Moye: [phone]  - Dra. Nicole Kindred: [phone]  En caso de inclemencias del [place], por favor llame a Johnsie Kindred principal al [phone] para una actualizacin sobre el [place] de cualquier retraso o cierre.  Consejos para la medicacin en dermatologa: Por favor, guarde las cajas en las que vienen los medicamentos de uso tpico para ayudarle a seguir las instrucciones sobre dnde y cmo usarlos. Las farmacias generalmente imprimen las instrucciones del medicamento slo en las cajas y no  directamente en los tubos del [place].   Si su medicamento es muy caro, por favor, pngase en contacto con Zigmund Daniel llamando al [phone] y presione la opcin 4 o envenos un mensaje a travs de Pharmacist, community.   No podemos decirle cul ser su copago por los medicamentos por adelantado ya que esto es diferente dependiendo de la cobertura de su seguro. Sin embargo, es posible que podamos encontrar un medicamento sustituto a Electrical engineer un formulario para que el seguro cubra el medicamento que se considera necesario.   Si se requiere una autorizacin previa para que su compaa de seguros [place] su medicamento, por favor permtanos de 1 a 2 das hbiles para completar este proceso.  Los precios de los medicamentos varan con frecuencia dependiendo del Environmental consultant de dnde se surte la receta y alguna farmacias pueden ofrecer precios ms baratos.  El sitio web www.goodrx.com tiene cupones para medicamentos de Airline pilot. Los precios aqu no tienen en cuenta lo que podra costar con la ayuda del seguro (puede ser ms barato con su  seguro), pero el sitio web puede darle el precio si no Field seismologist.  - Puede imprimir el cupn correspondiente y llevarlo con su receta a la farmacia.  - Tambin puede pasar por nuestra oficina durante el horario de atencin regular y Charity fundraiser una tarjeta de cupones de GoodRx.  - Si necesita que su receta se enve electrnicamente a una farmacia diferente, informe a nuestra oficina a travs de MyChart de [place] o por telfono llamando al [phone] y presione la opcin 4.

## 2022-12-27 NOTE — Progress Notes (Signed)
   Follow-Up Visit   Subjective  Dorothy Murray is a 72 y.o. female who presents for the following: SKs (Face, cosmetic. Treat today. She also has a growth on her right temple that she picks at. ).   The following portions of the chart were reviewed this encounter and updated as appropriate:       Review of Systems:  No other skin or systemic complaints except as noted in HPI or Assessment and Plan.  Objective  Well appearing patient in no apparent distress; mood and affect are within normal limits.  A focused examination was performed including face. Relevant physical exam findings are noted in the Assessment and Plan.  L forehead x 1, R forehead x 1 (2) Stuck-on, waxy, tan-brown papule or plaque --Discussed benign etiology and prognosis.          L lower temple x 1, R upper temple x 1 (2) Erythematous stuck-on, waxy papule    Assessment & Plan  Seborrheic keratosis (2) L forehead x 1, R forehead x 1  Discussed cosmetic procedure (cryotherapy), noncovered.  $60 for 1st lesion and $15 for each additional lesion if done on the same day.  Maximum charge $350.  One touch-up treatment included no charge. Discussed risks of treatment including dyspigmentation, small scar, and/or recurrence. Recommend daily broad spectrum sunscreen SPF 30+/photoprotection to treated areas once healed.   Destruction of lesion - L forehead x 1, R forehead x 1  Destruction method: cryotherapy   Informed consent: discussed and consent obtained   Lesion destroyed using liquid nitrogen: Yes   Region frozen until ice ball extended beyond lesion: Yes   Outcome: patient tolerated procedure well with no complications   Post-procedure details: wound care instructions given   Additional details:  Prior to procedure, discussed risks of blister formation, small wound, skin dyspigmentation, or rare scar following cryotherapy. Recommend Vaseline ointment to treated areas while healing.   Inflamed  seborrheic keratosis (2) L lower temple x 1, R upper temple x 1  Symptomatic, irritating, patient would like treated.  Destruction of lesion - L lower temple x 1, R upper temple x 1  Destruction method: cryotherapy   Informed consent: discussed and consent obtained   Lesion destroyed using liquid nitrogen: Yes   Region frozen until ice ball extended beyond lesion: Yes   Outcome: patient tolerated procedure well with no complications   Post-procedure details: wound care instructions given   Additional details:  Prior to procedure, discussed risks of blister formation, small wound, skin dyspigmentation, or rare scar following cryotherapy. Recommend Vaseline ointment to treated areas while healing.    Return in about 2 months (around 02/25/2023) for f/u cosmetic SKs.  IJamesetta [place], CMA, am acting as scribe for Brendolyn Patty, MD .  Documentation: I have reviewed the above documentation for accuracy and completeness, and I agree with the above.  Brendolyn Patty MD

## 2023-01-11 DIAGNOSIS — I10 Essential (primary) hypertension: Secondary | ICD-10-CM | POA: Diagnosis not present

## 2023-01-11 DIAGNOSIS — M810 Age-related osteoporosis without current pathological fracture: Secondary | ICD-10-CM | POA: Diagnosis not present

## 2023-01-11 DIAGNOSIS — E1165 Type 2 diabetes mellitus with hyperglycemia: Secondary | ICD-10-CM | POA: Diagnosis not present

## 2023-01-11 DIAGNOSIS — Z794 Long term (current) use of insulin: Secondary | ICD-10-CM | POA: Diagnosis not present

## 2023-01-13 DIAGNOSIS — M4316 Spondylolisthesis, lumbar region: Secondary | ICD-10-CM | POA: Diagnosis not present

## 2023-01-13 DIAGNOSIS — R7989 Other specified abnormal findings of blood chemistry: Secondary | ICD-10-CM | POA: Diagnosis not present

## 2023-01-13 DIAGNOSIS — Z794 Long term (current) use of insulin: Secondary | ICD-10-CM | POA: Diagnosis not present

## 2023-01-13 DIAGNOSIS — K5080 Crohn's disease of both small and large intestine without complications: Secondary | ICD-10-CM | POA: Diagnosis not present

## 2023-01-13 DIAGNOSIS — E781 Pure hyperglyceridemia: Secondary | ICD-10-CM | POA: Diagnosis not present

## 2023-01-13 DIAGNOSIS — E1165 Type 2 diabetes mellitus with hyperglycemia: Secondary | ICD-10-CM | POA: Diagnosis not present

## 2023-01-13 DIAGNOSIS — I10 Essential (primary) hypertension: Secondary | ICD-10-CM | POA: Diagnosis not present

## 2023-01-13 DIAGNOSIS — R8290 Unspecified abnormal findings in urine: Secondary | ICD-10-CM | POA: Diagnosis not present

## 2023-01-17 DIAGNOSIS — E781 Pure hyperglyceridemia: Secondary | ICD-10-CM | POA: Diagnosis not present

## 2023-01-17 DIAGNOSIS — Z794 Long term (current) use of insulin: Secondary | ICD-10-CM | POA: Diagnosis not present

## 2023-01-17 DIAGNOSIS — M4316 Spondylolisthesis, lumbar region: Secondary | ICD-10-CM | POA: Diagnosis not present

## 2023-01-17 DIAGNOSIS — I10 Essential (primary) hypertension: Secondary | ICD-10-CM | POA: Diagnosis not present

## 2023-01-17 DIAGNOSIS — E1165 Type 2 diabetes mellitus with hyperglycemia: Secondary | ICD-10-CM | POA: Diagnosis not present

## 2023-01-19 DIAGNOSIS — H353131 Nonexudative age-related macular degeneration, bilateral, early dry stage: Secondary | ICD-10-CM | POA: Diagnosis not present

## 2023-01-19 DIAGNOSIS — Z0100 Encounter for examination of eyes and vision without abnormal findings: Secondary | ICD-10-CM | POA: Diagnosis not present

## 2023-01-19 DIAGNOSIS — H524 Presbyopia: Secondary | ICD-10-CM | POA: Diagnosis not present

## 2023-01-20 DIAGNOSIS — Z0000 Encounter for general adult medical examination without abnormal findings: Secondary | ICD-10-CM | POA: Diagnosis not present

## 2023-01-20 DIAGNOSIS — I10 Essential (primary) hypertension: Secondary | ICD-10-CM | POA: Diagnosis not present

## 2023-01-20 DIAGNOSIS — E119 Type 2 diabetes mellitus without complications: Secondary | ICD-10-CM | POA: Diagnosis not present

## 2023-01-20 DIAGNOSIS — K5090 Crohn's disease, unspecified, without complications: Secondary | ICD-10-CM | POA: Diagnosis not present

## 2023-01-20 DIAGNOSIS — Z6831 Body mass index (BMI) 31.0-31.9, adult: Secondary | ICD-10-CM | POA: Diagnosis not present

## 2023-01-20 DIAGNOSIS — Z794 Long term (current) use of insulin: Secondary | ICD-10-CM | POA: Diagnosis not present

## 2023-02-02 ENCOUNTER — Other Ambulatory Visit: Payer: Self-pay

## 2023-02-02 DIAGNOSIS — R921 Mammographic calcification found on diagnostic imaging of breast: Secondary | ICD-10-CM

## 2023-02-23 DIAGNOSIS — E1165 Type 2 diabetes mellitus with hyperglycemia: Secondary | ICD-10-CM | POA: Diagnosis not present

## 2023-02-24 DIAGNOSIS — H353131 Nonexudative age-related macular degeneration, bilateral, early dry stage: Secondary | ICD-10-CM | POA: Diagnosis not present

## 2023-02-24 DIAGNOSIS — E119 Type 2 diabetes mellitus without complications: Secondary | ICD-10-CM | POA: Diagnosis not present

## 2023-02-24 DIAGNOSIS — H35033 Hypertensive retinopathy, bilateral: Secondary | ICD-10-CM | POA: Diagnosis not present

## 2023-02-25 DIAGNOSIS — M25511 Pain in right shoulder: Secondary | ICD-10-CM | POA: Diagnosis not present

## 2023-02-25 DIAGNOSIS — E781 Pure hyperglyceridemia: Secondary | ICD-10-CM | POA: Diagnosis not present

## 2023-02-25 DIAGNOSIS — E119 Type 2 diabetes mellitus without complications: Secondary | ICD-10-CM | POA: Diagnosis not present

## 2023-02-25 DIAGNOSIS — Z794 Long term (current) use of insulin: Secondary | ICD-10-CM | POA: Diagnosis not present

## 2023-02-25 DIAGNOSIS — K5090 Crohn's disease, unspecified, without complications: Secondary | ICD-10-CM | POA: Diagnosis not present

## 2023-02-25 DIAGNOSIS — M5412 Radiculopathy, cervical region: Secondary | ICD-10-CM | POA: Diagnosis not present

## 2023-02-25 DIAGNOSIS — I10 Essential (primary) hypertension: Secondary | ICD-10-CM | POA: Diagnosis not present

## 2023-02-28 ENCOUNTER — Ambulatory Visit: Payer: Medicare HMO | Admitting: Dermatology

## 2023-02-28 VITALS — BP 143/74 | HR 83

## 2023-02-28 DIAGNOSIS — D692 Other nonthrombocytopenic purpura: Secondary | ICD-10-CM | POA: Diagnosis not present

## 2023-02-28 DIAGNOSIS — L821 Other seborrheic keratosis: Secondary | ICD-10-CM | POA: Diagnosis not present

## 2023-02-28 DIAGNOSIS — B079 Viral wart, unspecified: Secondary | ICD-10-CM

## 2023-02-28 DIAGNOSIS — L814 Other melanin hyperpigmentation: Secondary | ICD-10-CM

## 2023-02-28 NOTE — Patient Instructions (Addendum)
Cryotherapy Aftercare  Wash gently with soap and water everyday.   Apply Vaseline and Band-Aid daily until healed.    Viral Warts & Molluscum Contagiosum  Viral warts and molluscum contagiosum are growths of the skin caused by viral infection of the skin. If you have been given the diagnosis of viral warts or molluscum contagiosum there are a few things that you must understand about your condition:  There is no guaranteed treatment method available for this condition. Multiple treatments may be required, The treatments may be time consuming and require multiple visits to the dermatology office. The treatment may be expensive. You will be charged each time you come into the office to have the spots treated. The treated areas may develop new lesions further complicating treatment. The treated areas may leave a scar. There is no guarantee that even after multiple treatments that the spots will be successfully treated. These are caused by a viral infection and can be spread to other areas of the skin and to other people by direct contact. Therefore, new spots may occur.  Recommend daily broad spectrum sunscreen SPF 30+ to sun-exposed areas, reapply every 2 hours as needed. Call for new or changing lesions.  Staying in the shade or wearing long sleeves, sun glasses (UVA+UVB protection) and wide brim hats (4-inch brim around the entire circumference of the hat) are also recommended for sun protection.   Due to recent changes in healthcare laws, you may see results of your pathology and/or laboratory studies on MyChart before the doctors have had a chance to review them. We understand that in some cases there may be results that are confusing or concerning to you. Please understand that not all results are received at the same time and often the doctors may need to interpret multiple results in order to provide you with the best plan of care or course of treatment. Therefore, we ask that you please  give [place] 2 business days to thoroughly review all your results before contacting the office for clarification. Should we see a critical lab result, you will be contacted sooner.   If You Need Anything After Your Visit  If you have any questions or concerns for your doctor, please call our main line at [phone] and press option 4 to reach your doctor's medical assistant. If no one answers, please leave a voicemail as directed and we will return your call as soon as possible. Messages left after 4 pm will be answered the following business day.   You may also send [place] a message via [place]. We typically respond to MyChart messages within 1-2 business days.  For prescription refills, please ask your pharmacy to contact our office. Our fax number is [phone].  If you have an urgent issue when the clinic is closed that cannot wait until the next business day, you can page your doctor at the number below.    Please note that while we do our best to be available for urgent issues outside of office hours, we are not available 24/7.   If you have an urgent issue and are unable to reach [place], you may choose to seek medical care at your doctor's office, retail clinic, urgent care center, or emergency room.  If you have a medical emergency, please immediately call 911 or go to the emergency department.  Pager Numbers  - Dr. Nehemiah Massed: [phone]  - Dr. Laurence Ferrari: [phone]  - Dr. Nicole Kindred: [phone]  In the event of inclement weather, please call our main  line at [phone] for an update on the status of any delays or closures.  Dermatology Medication Tips: Please keep the boxes that topical medications come in in order to help keep track of the instructions about where and how to use these. Pharmacies typically print the medication instructions only on the boxes and not directly on the medication tubes.   If your medication is too expensive, please contact our office at [phone]  option 4 or send [place] a message through [place].   We are unable to tell what your co-pay for medications will be in advance as this is different depending on your insurance coverage. However, we may be able to find a substitute medication at lower cost or fill out paperwork to get insurance to cover a needed medication.   If a prior authorization is required to get your medication covered by your insurance company, please allow [place] 1-2 business days to complete this process.  Drug prices often vary depending on where the prescription is filled and some pharmacies may offer cheaper prices.  The website www.goodrx.com contains coupons for medications through different pharmacies. The prices here do not account for what the cost may be with help from insurance (it may be cheaper with your insurance), but the website can give you the price if you did not use any insurance.  - You can print the associated coupon and take it with your prescription to the pharmacy.  - You may also stop by our office during regular business hours and pick up a GoodRx coupon card.  - If you need your prescription sent electronically to a different pharmacy, notify our office through [facility] or by phone at [phone] option 4.     Si Usted Necesita Algo Despus de Su Visita  Tambin puede enviarnos un mensaje a travs de Pharmacist, community. Por lo general respondemos a los mensajes de MyChart en el transcurso de 1 a 2 das hbiles.  Para renovar recetas, por favor pida a su farmacia que se ponga en contacto con nuestra oficina. Harland Dingwall de fax es [place] [phone].  Si tiene un asunto urgente cuando la clnica est cerrada y que no puede esperar hasta el siguiente da hbil, puede llamar/localizar a su doctor(a) al nmero que aparece a continuacin.   Por favor, tenga en cuenta que aunque hacemos todo lo posible para estar disponibles para asuntos urgentes fuera del horario de [place], no estamos disponibles las  24 horas del da, los 7 das de la [place].   Si tiene un problema urgente y no puede comunicarse con nosotros, puede optar por buscar atencin mdica  en el consultorio de su doctor(a), en una clnica privada, en un centro de atencin urgente o en una sala de emergencias.  Si tiene Engineering geologist, por favor llame inmediatamente al 911 o vaya a la sala de emergencias.  Nmeros de bper  - Dr. Nehemiah Massed: [phone]  - Dra. Moye: [phone]  - Dra. Nicole Kindred: [phone]  En caso de inclemencias del [place], por favor llame a Johnsie Kindred principal al [phone] para una actualizacin sobre el [place] de cualquier retraso o cierre.  Consejos para la medicacin en dermatologa: Por favor, guarde las cajas en las que vienen los medicamentos de uso tpico para ayudarle a seguir las instrucciones sobre dnde y cmo usarlos. Las farmacias generalmente imprimen las instrucciones del medicamento slo en las cajas y no directamente en los tubos del [place].   Si su medicamento es [employer], por favor, pngase en  contacto con Zigmund Daniel llamando al [phone] y presione la opcin 4 o envenos un mensaje a travs de Pharmacist, community.   No podemos decirle cul ser su copago por los medicamentos por adelantado ya que esto es diferente dependiendo de la cobertura de su seguro. Sin embargo, es posible que podamos encontrar un medicamento sustituto a Electrical engineer un formulario para que el seguro cubra el medicamento que se considera necesario.   Si se requiere una autorizacin previa para que su compaa de seguros [place] su medicamento, por favor permtanos de 1 a 2 das hbiles para completar este proceso.  Los precios de los medicamentos varan con frecuencia dependiendo del Environmental consultant de dnde se surte la receta y alguna farmacias pueden ofrecer precios ms baratos.  El sitio web www.goodrx.com tiene cupones para medicamentos de Airline pilot. Los precios aqu no tienen en cuenta lo  que podra costar con la ayuda del seguro (puede ser ms barato con su seguro), pero el sitio web puede darle el precio si no utiliz Research scientist (physical sciences).  - Puede imprimir el cupn correspondiente y llevarlo con su receta a la farmacia.  - Tambin puede pasar por nuestra oficina durante el horario de atencin regular y Charity fundraiser una tarjeta de cupones de GoodRx.  - Si necesita que su receta se enve electrnicamente a una farmacia diferente, informe a nuestra oficina a travs de MyChart de [place] o por telfono llamando al [phone] y presione la opcin 4.

## 2023-02-28 NOTE — Addendum Note (Signed)
Addended by: Brendolyn Patty on: 02/28/2023 07:26 PM   Modules accepted: Level of Service

## 2023-02-28 NOTE — Progress Notes (Deleted)
   Follow-Up Visit   Subjective  Dorothy Murray is a 72 y.o. female who presents for the following: Seborrheic Keratosis (Patient here for follow up on cosmetic sks. ).  ***  The following portions of the chart were reviewed this encounter and updated as appropriate:      {Review of Systems:34166::"No other skin or systemic complaints."}  Objective  Well appearing patient in no apparent distress; mood and affect are within normal limits.  [id number] full examination was performed including scalp, head, eyes, ears, nose, lips, neck, chest, axillae, abdomen, back, buttocks, bilateral upper extremities, bilateral lower extremities, hands, feet, fingers, toes, fingernails, and toenails. All findings within normal limits unless otherwise noted below."}   Assessment & Plan   No follow-ups on file.

## 2023-02-28 NOTE — Progress Notes (Signed)
   Follow-Up Visit   Subjective  Dorothy Murray is a 72 y.o. female who presents for the following: Follow-up (2 month follow-up cosmetic Sks of the left and right forehead. Patient states areas are improved and she is pleased with treatment. ). She also has brown spots on her arms she would like checked.    The following portions of the chart were reviewed this encounter and updated as appropriate:       Review of Systems:  No other skin or systemic complaints except as noted in HPI or Assessment and Plan.  Objective  Well appearing patient in no apparent distress; mood and affect are within normal limits.  A focused examination was performed including face. Relevant physical exam findings are noted in the Assessment and Plan.  Right Forehead, Left Forehead Slightly flesh colored flat papules. No pigment when compared to baseline photo  R mid forearm x 1 Verrucous papule.    Assessment & Plan  Seborrheic Keratoses - Stuck-on, waxy, tan-brown papules and/or plaques  - Benign-appearing - Discussed benign etiology and prognosis. - Observe - Call for any changes  Purpura - Chronic; persistent and recurrent.  Treatable, but not curable. - Violaceous macules and patches - Benign - Related to trauma, age, sun damage and/or use of blood thinners, chronic use of topical and/or oral steroids - Observe - Can use OTC arnica containing moisturizer such as Dermend Bruise Formula if desired - Call for worsening or other concerns  Lentigines - Scattered tan macules - Due to sun exposure - Benign-appearing, observe - Recommend daily broad spectrum sunscreen SPF 30+ to sun-exposed areas, reapply every 2 hours as needed. - Call for any changes  Seborrheic keratosis Right Forehead, Left Forehead  S/p cosmetic cryotherapy treatment. Improved. Patient pleased with results. Defers additional treatment today. Observation.   Viral warts, unspecified type R mid forearm x 1  vs  ISK.  Viral Wart (HPV) Counseling  Discussed viral / HPV (Human Papilloma Virus) etiology and risk of spread /infectivity to other areas of body as well as to other people.  Multiple treatments and methods may be required to clear warts and it is possible treatment may not be successful.  Treatment risks include discoloration; scarring and there is still potential for wart recurrence.  Destruction of lesion - R mid forearm x 1  Destruction method: cryotherapy   Informed consent: discussed and consent obtained   Lesion destroyed using liquid nitrogen: Yes   Region frozen until ice ball extended beyond lesion: Yes   Outcome: patient tolerated procedure well with no complications   Post-procedure details: wound care instructions given   Additional details:  Prior to procedure, discussed risks of blister formation, small wound, skin dyspigmentation, or rare scar following cryotherapy. Recommend Vaseline ointment to treated areas while healing.    Return if symptoms worsen or fail to improve.  IJamesetta Orleans, CMA, am acting as scribe for Brendolyn Patty, MD .  Documentation: I have reviewed the above documentation for accuracy and completeness, and I agree with the above.  Brendolyn Patty MD

## 2023-03-01 DIAGNOSIS — E1165 Type 2 diabetes mellitus with hyperglycemia: Secondary | ICD-10-CM | POA: Diagnosis not present

## 2023-03-01 DIAGNOSIS — E781 Pure hyperglyceridemia: Secondary | ICD-10-CM | POA: Diagnosis not present

## 2023-03-01 DIAGNOSIS — Z794 Long term (current) use of insulin: Secondary | ICD-10-CM | POA: Diagnosis not present

## 2023-03-01 DIAGNOSIS — I10 Essential (primary) hypertension: Secondary | ICD-10-CM | POA: Diagnosis not present

## 2023-03-14 DIAGNOSIS — J019 Acute sinusitis, unspecified: Secondary | ICD-10-CM | POA: Diagnosis not present

## 2023-03-14 DIAGNOSIS — Z03818 Encounter for observation for suspected exposure to other biological agents ruled out: Secondary | ICD-10-CM | POA: Diagnosis not present

## 2023-03-14 DIAGNOSIS — R051 Acute cough: Secondary | ICD-10-CM | POA: Diagnosis not present

## 2023-03-28 DIAGNOSIS — E781 Pure hyperglyceridemia: Secondary | ICD-10-CM | POA: Diagnosis not present

## 2023-03-28 DIAGNOSIS — I10 Essential (primary) hypertension: Secondary | ICD-10-CM | POA: Diagnosis not present

## 2023-03-28 DIAGNOSIS — E1165 Type 2 diabetes mellitus with hyperglycemia: Secondary | ICD-10-CM | POA: Diagnosis not present

## 2023-03-28 DIAGNOSIS — Z794 Long term (current) use of insulin: Secondary | ICD-10-CM | POA: Diagnosis not present

## 2023-04-04 ENCOUNTER — Ambulatory Visit
Admission: RE | Admit: 2023-04-04 | Discharge: 2023-04-04 | Disposition: A | Discharge status: Home / Self Care | Payer: Medicare HMO | Source: Ambulatory Visit | Attending: Surgery | Admitting: Surgery

## 2023-04-04 ENCOUNTER — Encounter: Payer: Self-pay | Admitting: Obstetrics & Gynecology

## 2023-04-04 ENCOUNTER — Ambulatory Visit: Payer: Medicare HMO | Admitting: Obstetrics & Gynecology

## 2023-04-04 VITALS — BP 146/82 | Ht 65.5 in | Wt 158.0 lb

## 2023-04-04 DIAGNOSIS — R921 Mammographic calcification found on diagnostic imaging of breast: Secondary | ICD-10-CM | POA: Insufficient documentation

## 2023-04-04 DIAGNOSIS — R32 Unspecified urinary incontinence: Secondary | ICD-10-CM | POA: Diagnosis not present

## 2023-04-04 DIAGNOSIS — N94819 Vulvodynia, unspecified: Secondary | ICD-10-CM

## 2023-04-04 DIAGNOSIS — R928 Other abnormal and inconclusive findings on diagnostic imaging of breast: Secondary | ICD-10-CM | POA: Diagnosis not present

## 2023-04-04 DIAGNOSIS — N6489 Other specified disorders of breast: Secondary | ICD-10-CM | POA: Diagnosis not present

## 2023-04-04 MED ORDER — ESTROGENS CONJUGATED 0.625 MG/GM VA CREA: TOPICAL_CREAM | VAGINAL | 12 refills | Status: DC

## 2023-04-04 NOTE — Progress Notes (Signed)
    GYNECOLOGY PROGRESS NOTE  Subjective:    Patient ID: Dorothy Murray, female    DOB: 09-23-1951, 72 y.o.   MRN: [id number]  HPI  Patient is a 72 y.o. G2P2  who presents for recurrent long standing vulvar rash. In the past, she has been treated with temovate and nystatin. She has not used these for about a year. She walks for exercise about 5 days per week at a location without a bathroom so she wears "diapers" at those times since she cannot hold her urine if she really has to go. Otherwise, she denies that her vulva gets exposed to urine.  The following portions of the patient's history were reviewed and updated as appropriate: allergies, current medications, past family history, past medical history, past social history, past surgical history, and problem list.  Review of Systems Pertinent items are noted in HPI.  She is married but not sexually active.  Objective:   Blood pressure (!) 146/82, height 5' 5.5" (1.664 m), weight 158 lb (71.7 kg). Body mass index is 25.89 kg/m. General appearance: alert Abdomen: soft, non-tender; bowel sounds normal; no masses,  no organomegaly EG- vulva appears as if it is exposed to urine on a daily basis (see photo under media). The labia have fused over the clitoris, there is some mild maceration on the left inner labia majora Extremities: extremities normal, atraumatic, no cyanosis or edema Neurologic: Grossly normal   Assessment:  Vulvar pain and severe atrophy Exposure to urine  Plan:   I have given her premarin cream samples and prescription for QO night use to vulva. She will need to see a urologist to help with the incontinence to stop the exposure of the vulva to urine. She will come back in 3-4 weeks for a re check. She may need a vulvar biopsy then or addition of temovate cream to vulva.

## 2023-04-11 ENCOUNTER — Ambulatory Visit: Payer: Medicare HMO | Admitting: Surgery

## 2023-04-11 ENCOUNTER — Encounter: Payer: Self-pay | Admitting: Surgery

## 2023-04-11 VITALS — BP 125/77 | HR 90 | Temp 98.0°F | Ht 65.5 in | Wt 158.0 lb

## 2023-04-11 DIAGNOSIS — R921 Mammographic calcification found on diagnostic imaging of breast: Secondary | ICD-10-CM

## 2023-04-11 NOTE — Patient Instructions (Addendum)
Follow up here in 6 months, September/October, with bilateral mammogram and office visit. We will send you a letter about these appointments.   Continue self breast exams. Call office for any new breast issues or concerns.   Breast Self-Awareness Breast self-awareness means being familiar with how your breasts look and feel. It involves checking your breasts regularly and telling your health care provider about any changes. Practicing breast self-awareness helps to maintain breast health. Sometimes, changes are not harmful (are benign). Other times, a change in your breasts can be a sign of a serious medical problem. Being familiar with the look and feel of your breasts can help you catch a breast problem while it is still small and can be treated. You should do breast self-exams even if you have breast implants. What you need: A mirror. A well-lit room. A pillow or other soft object. How to do a breast self-exam A breast self-exam is one way to learn what is normal for your breasts and whether your breasts are changing. To do a breast self-exam: Look for changes  Remove all the clothing above your waist. Stand in front of a mirror in a room with good lighting. Put your hands down at your sides. Compare your breasts in the mirror. Look for differences between them (asymmetry), such as: Differences in shape. Differences in size. Puckers, dips, and bumps in one breast and not the other. Look at each breast for changes in the skin, such as: Redness. Scaly areas. Skin thickening. Dimpling. Open sores (ulcers). Look for changes in your nipples, such as: Discharge. Bleeding. Dimpling. Redness. A nipple that looks pushed in (retracted), or that has changed position. Feel for changes Carefully feel your breasts for lumps and changes. It is best to do this self-exam while lying down. Follow these steps to feel each breast: Place a pillow under the shoulder of one side of your  body. Place the arm of that side of your body behind your head. Feel the breast of that side of your body using the hand of the opposite arm. To do this: Start in the nipple area and use the pads of your three middle fingers to make -inch (2 cm) overlapping circles. Use light, medium, and then firm pressure as you feel your breast, gently covering the entire breast area and armpit. Continue the overlapping circles, moving downward over the breast until you feel your ribs below your breast. Then, make circles with your fingers going upward until you reach your collarbone. Next, make circles by moving outward across your breast and into your armpit area. Squeeze the nipple. Check for discharge and lumps. Repeat steps 1-7 to check your other breast. Sit or stand in the tub or shower. With soapy water on your skin, feel each breast the same way you did when you were lying down. Write down what you find Writing down what you find can help you remember what to discuss with your health care provider. Write down: What is normal for each breast. Any changes that you find in each breast. These include: The kind of changes you find. Any pain or tenderness. Size and location of any lumps. Where you are in your menstrual cycle, if you are still getting your menstrual period (menstruating). General tips If you are breastfeeding, the best time to examine your breasts is after a feeding or after using a breast pump. If you menstruate, the best time to examine your breasts is 5-7 days after your menstrual period. Breasts are  generally lumpier during menstrual periods, and it may be more difficult to notice changes. With time and practice, you will become more familiar with the differences in your breasts and more comfortable with the exam. Contact a health care provider if: You see a change in the shape or size of your breasts or nipples. You see a change in the skin of your breast or nipples, such as a  reddened or scaly area. You have unusual discharge from your nipples. You find a new lump or thick area. You have breast pain. You have any concerns about your breast health. Summary Breast self-awareness includes looking for physical changes in your breasts and feeling for any changes within your breasts. Breast self-awareness should be done in front of a mirror in a well-lit room. If you menstruate, the best time to examine your breasts is 5-7 days after your menstrual period. Tell your health care provider about any changes you notice in your breasts. Changes include changes in size, changes on the skin, pain or tenderness, or unusual fluid from your nipples. This information is not intended to replace advice given to you by your health care provider. Make sure you discuss any questions you have with your health care provider. Document Revised: 05/06/2022 Document Reviewed: 10/01/2021 Elsevier Patient Education  2023 [employer].

## 2023-04-12 NOTE — Progress Notes (Signed)
Outpatient Surgical Follow Up  04/12/2023  Dorothy Murray is an 72 y.o. female.   Chief Complaint  Patient presents with   Follow-up    HPI: Dorothy Murray is a 72 y.o. 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 Evette Cristal on May 2017 no dysplasia.  She is following up after having a recent  mammogram and U/S that I have personally reviewed showing  likely benign asymmetry on mammo on the Left ,  No definitive suspicious masses.  She also denies any new lumps bumps or nipple discharge.  No weight loss no fevers no chills.  She does self breast exams No new changes on her health  Past Medical History:  Diagnosis Date   Arthritis    lower back   Crohn's disease (HCC)    Diabetes mellitus without complication (HCC)    NIDDM   Diffuse cystic mastopathy    Dysrhythmia    Hypertension     Past Surgical History:  Procedure Laterality Date   BACK SURGERY  2019   BREAST BIOPSY Bilateral 04/19/2016   stereo w/ Dr. Lemar Livings, benign   BREAST EXCISIONAL BIOPSY Right 04/19/2016   w/Dr. Lemar Livings, benign    BREAST LUMPECTOMY Right 05/03/2016   ATYPICAL DUCTAL HYPERPLASIA (ADH) WITH MICROCALCIFICATIONS/: BREAST LUMPECTOMY; [place] Abts, MD;  Location: [facility];  Service: General;  Laterality: Right;   BREAST LUMPECTOMY WITH NEEDLE LOCALIZATION Right 05/03/2016   Procedure: BREAST LUMPECTOMY WITH NEEDLE LOCALIZATION;  Surgeon: Kieth Brightly, MD;  Location: [facility];  Service: General;  Laterality: Right;   CHOLECYSTECTOMY     COLONOSCOPY  2010, 2015   [facility]   COLONOSCOPY  12/2013   COLONOSCOPY WITH PROPOFOL N/A 07/17/2018   Procedure: COLONOSCOPY WITH PROPOFOL;  Surgeon: Scot Jun, MD;  Location: [facility];  Service: Endoscopy;  Laterality: N/A;   HAMMER TOE SURGERY Right 06/07/2019   Procedure: HAMMER TOE CORRECTION RIGHT 2ND;  Surgeon: Recardo Evangelist, DPM;  Location: [facility];  Service: Podiatry;  Laterality: Right;  Diabetes  - insulin and oral meds   OSTECTOMY Right 06/07/2019   Procedure: DOUBLE OSTEOTOMY RIGHT;  Surgeon: Recardo Evangelist, DPM;  Location: [facility];  Service: Podiatry;  Laterality: Right;   WEIL OSTEOTOMY Right 06/07/2019   Procedure: WEIL OSTEOTOMY RIGHT 2ND;  Surgeon: Recardo Evangelist, DPM;  Location: [facility];  Service: Podiatry;  Laterality: Right;    Family History  Problem Relation Age of Onset   Heart attack Mother    Heart attack Father    Breast cancer Sister    Stomach cancer Sister    Stomach cancer Brother    Breast cancer Daughter 61       Evern Core   Colon cancer Son 66    Social History:  reports that she has never smoked. She has never been exposed to tobacco smoke. She has never used smokeless tobacco. She reports that she does not drink alcohol and does not use drugs.  Allergies:  Allergies  Allergen Reactions   Ciprofloxacin Hives    Medications reviewed.    ROS Full ROS performed and is otherwise negative other than what is stated in HPI   BP 125/77   Pulse 90   Temp 98 F (36.7 C)   Ht 5' 5.5" (1.664 m)   Wt 158 lb (71.7 kg)   SpO2 99%   BMI 25.89 kg/m   Physical Exam Chaperone present CONSTITUTIONAL: NAD EYES: Pupils are equal,  round, and reactive to light, Sclera are non-icteric. LYMPH NODES:  Lymph nodes in the neck are normal. RESPIRATORY:  Lungs are clear. There is normal respiratory effort, with equal breath sounds bilaterally, and without pathologic use of accessory muscles. CARDIOVASCULAR: Heart is regular without murmurs, gallops, or rubs. BREAST: previous Right lumpectomy scar, no new masses, no LAD, no DC or nipple changes. GI: The abdomen is  soft, nontender, and nondistended. There are no palpable masses. There is no hepatosplenomegaly. There are normal bowel sounds in all quadrants. GU: Rectal deferred.   MUSCULOSKELETAL: Normal muscle strength and tone. No cyanosis or edema.   SKIN: Turgor is good and there  are no pathologic skin lesions or ulcers. NEUROLOGIC: Motor and sensation is grossly normal. Cranial nerves are grossly intact. PSYCH:  Oriented to person, place and time. Affect is normal   Assessment/Plan: 72 year old Hx of ADH following up with benign asymmetry on mammo on the Left  Recommend 6 months follow-up with physical exam and mammogram.  No need for biopsies or further test at this time Please note that I have spent 30 minutes in this encounter including personally reviewing imaging studies, coordinating care, placing orders, performing appropriate documentation.     Sterling Big, MD [facility] General Surgeon

## 2023-04-20 ENCOUNTER — Telehealth: Payer: Self-pay | Admitting: Obstetrics and Gynecology

## 2023-04-20 NOTE — Telephone Encounter (Signed)
Left patient a generic message for patient to call the office for a schedule change.  Appointment is still on 05/12/2023, but at 2:35pm.

## 2023-04-20 NOTE — Telephone Encounter (Signed)
Patient has called back and confirmed 05/12/2023 at 2:35 with Dr. Valentino Saxon

## 2023-04-21 DIAGNOSIS — I10 Essential (primary) hypertension: Secondary | ICD-10-CM | POA: Diagnosis not present

## 2023-04-21 DIAGNOSIS — Z794 Long term (current) use of insulin: Secondary | ICD-10-CM | POA: Diagnosis not present

## 2023-04-21 DIAGNOSIS — E1165 Type 2 diabetes mellitus with hyperglycemia: Secondary | ICD-10-CM | POA: Diagnosis not present

## 2023-04-21 DIAGNOSIS — E781 Pure hyperglyceridemia: Secondary | ICD-10-CM | POA: Diagnosis not present

## 2023-04-27 ENCOUNTER — Other Ambulatory Visit: Payer: Self-pay | Admitting: Surgery

## 2023-04-27 ENCOUNTER — Encounter: Payer: Self-pay | Admitting: Surgery

## 2023-04-27 DIAGNOSIS — Z1231 Encounter for screening mammogram for malignant neoplasm of breast: Secondary | ICD-10-CM

## 2023-04-27 DIAGNOSIS — R928 Other abnormal and inconclusive findings on diagnostic imaging of breast: Secondary | ICD-10-CM

## 2023-04-27 DIAGNOSIS — N6489 Other specified disorders of breast: Secondary | ICD-10-CM

## 2023-05-12 ENCOUNTER — Ambulatory Visit: Payer: Medicare HMO | Admitting: Obstetrics and Gynecology

## 2023-05-24 DIAGNOSIS — E1165 Type 2 diabetes mellitus with hyperglycemia: Secondary | ICD-10-CM | POA: Diagnosis not present

## 2023-05-26 ENCOUNTER — Encounter: Payer: Self-pay | Admitting: Obstetrics and Gynecology

## 2023-05-26 ENCOUNTER — Ambulatory Visit: Payer: Medicare HMO | Admitting: Obstetrics and Gynecology

## 2023-05-26 VITALS — BP 131/60 | HR 90 | Resp 16 | Ht 65.5 in | Wt 162.1 lb

## 2023-05-26 DIAGNOSIS — Q525 Fusion of labia: Secondary | ICD-10-CM

## 2023-05-26 NOTE — Progress Notes (Signed)
    GYNECOLOGY PROGRESS NOTE  Subjective:    Patient ID: Dorothy Murray, female    DOB: 12/10/1951, 72 y.o.   MRN: [id number]  HPI  Patient is a 72 y.o. G2P2 female who presents for evaluation of vulvar rash and possible vulvar biopsy.  Patient has a longstanding history of vulvar rash.  She was prescribed Premarin cream for labial agglutination last visit in March, was seen by Dr. Nicholaus Bloom.  Patient today notes that her rash seems to be improving.  Also notes that she is no longer having issues with urination or incontinence.  Has questions if she will be able to ever be sexually active again.  Is currently married  The following portions of the patient's history were reviewed and updated as appropriate: allergies, current medications, past family history, past medical history, past social history, past surgical history, and problem list.  Review of Systems Pertinent items noted in HPI and remainder of comprehensive ROS otherwise negative.   Objective:   Blood pressure 131/60, pulse 90, resp. rate 16, height 5' 5.5" (1.664 m), weight 162 lb 1.6 oz (73.5 kg). Body mass index is 26.56 kg/m. General appearance: alert and no distress Abdomen: soft, non-tender; bowel sounds normal; no masses,  no organomegaly Pelvic: External genitalia with moderate vaginal atrophy.  Areas of agglutination noted near clitoral hood posterior fourchette also with mild agglutination.  Mild maceration on the left inner labia majora.   Assessment:   1. Labia minora agglutination      Plan:   - Advised to continue use of premarin cream as prescribed, utilizing every other day.  Advised that she can also use internally to aid with atrophy this may be causing sexual discomfort.. -To follow-up in 2 to 3 months to reassess symptoms.Dorothy Laser, MD [facility]

## 2023-07-14 DIAGNOSIS — R8290 Unspecified abnormal findings in urine: Secondary | ICD-10-CM | POA: Diagnosis not present

## 2023-07-14 DIAGNOSIS — E1165 Type 2 diabetes mellitus with hyperglycemia: Secondary | ICD-10-CM | POA: Diagnosis not present

## 2023-07-14 DIAGNOSIS — Z794 Long term (current) use of insulin: Secondary | ICD-10-CM | POA: Diagnosis not present

## 2023-07-14 DIAGNOSIS — I10 Essential (primary) hypertension: Secondary | ICD-10-CM | POA: Diagnosis not present

## 2023-07-14 DIAGNOSIS — K5080 Crohn's disease of both small and large intestine without complications: Secondary | ICD-10-CM | POA: Diagnosis not present

## 2023-07-14 DIAGNOSIS — Z6831 Body mass index (BMI) 31.0-31.9, adult: Secondary | ICD-10-CM | POA: Diagnosis not present

## 2023-07-14 DIAGNOSIS — Z0000 Encounter for general adult medical examination without abnormal findings: Secondary | ICD-10-CM | POA: Diagnosis not present

## 2023-07-21 DIAGNOSIS — I10 Essential (primary) hypertension: Secondary | ICD-10-CM | POA: Diagnosis not present

## 2023-07-21 DIAGNOSIS — K5090 Crohn's disease, unspecified, without complications: Secondary | ICD-10-CM | POA: Diagnosis not present

## 2023-07-21 DIAGNOSIS — Z794 Long term (current) use of insulin: Secondary | ICD-10-CM | POA: Diagnosis not present

## 2023-07-21 DIAGNOSIS — E119 Type 2 diabetes mellitus without complications: Secondary | ICD-10-CM | POA: Diagnosis not present

## 2023-08-23 ENCOUNTER — Encounter: Payer: Self-pay | Admitting: Obstetrics and Gynecology

## 2023-08-23 ENCOUNTER — Ambulatory Visit: Payer: Medicare HMO | Admitting: Obstetrics and Gynecology

## 2023-08-23 VITALS — BP 126/73 | HR 95 | Resp 16 | Ht 65.0 in | Wt 165.0 lb

## 2023-08-23 DIAGNOSIS — Q525 Fusion of labia: Secondary | ICD-10-CM

## 2023-08-23 DIAGNOSIS — N905 Atrophy of vulva: Secondary | ICD-10-CM

## 2023-08-23 DIAGNOSIS — N952 Postmenopausal atrophic vaginitis: Secondary | ICD-10-CM

## 2023-08-23 NOTE — Progress Notes (Signed)
    GYNECOLOGY PROGRESS NOTE  Subjective:    Patient ID: Dorothy Murray, female    DOB: Jul 10, 1951, 72 y.o.   MRN: [id number]  HPI  Patient is a 72 y.o. G2P2 female who presents for follow up for vulvo-vaginal atrophy and labial agglutination. She was prescribed premarin at her last visit for treatment. She reports that the premarin has helped, she is not as sore as she has been. Is applying every other day.   The following portions of the patient's history were reviewed and updated as appropriate: allergies, current medications, past family history, past medical history, past social history, past surgical history, and problem list.  Review of Systems Pertinent items noted in HPI and remainder of comprehensive ROS otherwise negative.   Objective:   Blood pressure 126/73, pulse 95, resp. rate 16, height 5\' 5"  (1.651 m), weight 165 lb (74.8 kg). Body mass index is 27.46 kg/m. General appearance: alert and no distress Pelvic: External genitalia with moderate vulvar atrophy of labia minora (however improved since last visit). Areas of agglutination improved noted near clitoral hood posterior fourchette.   Assessment:   1. Labia minora agglutination   2. Vulvar atrophy   3. Vaginal atrophy      Plan:   - Improvement in vulvar atrophy  noted. Encouraged to continue use of Premarin cream every other day for an additional month, after this time, can decrease to twice weekly.  - Vaginal atrophy also present, however symptoms internally are not as bothersome to patient. Can use Premarin cream internally as well if symptomatic.  - Can follow up as needed, or if symptoms worsen or fail to continue to improve.   Hildred Laser, MD [facility]

## 2023-08-24 DIAGNOSIS — E1165 Type 2 diabetes mellitus with hyperglycemia: Secondary | ICD-10-CM | POA: Diagnosis not present

## 2023-09-12 ENCOUNTER — Ambulatory Visit
Admission: RE | Admit: 2023-09-12 | Discharge: 2023-09-12 | Disposition: A | Discharge status: Home / Self Care | Payer: Medicare HMO | Source: Ambulatory Visit | Attending: Surgery | Admitting: Surgery

## 2023-09-12 DIAGNOSIS — R928 Other abnormal and inconclusive findings on diagnostic imaging of breast: Secondary | ICD-10-CM | POA: Diagnosis not present

## 2023-09-12 DIAGNOSIS — N6489 Other specified disorders of breast: Secondary | ICD-10-CM | POA: Diagnosis not present

## 2023-09-12 DIAGNOSIS — Z1231 Encounter for screening mammogram for malignant neoplasm of breast: Secondary | ICD-10-CM

## 2023-09-12 DIAGNOSIS — R92323 Mammographic fibroglandular density, bilateral breasts: Secondary | ICD-10-CM | POA: Diagnosis not present

## 2023-09-19 ENCOUNTER — Encounter: Payer: Self-pay | Admitting: Surgery

## 2023-09-19 ENCOUNTER — Ambulatory Visit: Payer: Medicare HMO | Admitting: Surgery

## 2023-09-19 VITALS — BP 138/77 | HR 76 | Temp 98.0°F | Ht 65.0 in | Wt 162.0 lb

## 2023-09-19 DIAGNOSIS — N6091 Unspecified benign mammary dysplasia of right breast: Secondary | ICD-10-CM | POA: Diagnosis not present

## 2023-09-19 NOTE — Patient Instructions (Addendum)
Patient will be asked to return to the office in one year with a bilateral screening mammogram.  We will send you a letter about these appointments.   Continue self breast exams. Call office for any new breast issues or concerns.   Breast Self-Awareness Breast self-awareness is knowing how your breasts look and feel. You need to: Check your breasts on a regular basis. Tell your doctor about any changes. Become familiar with the look and feel of your breasts. This can help you catch a breast problem while it is still small and can be treated. You should do breast self-exams even if you have breast implants. What you need: A mirror. A well-lit room. A pillow or other soft object. How to do a breast self-exam Follow these steps to do a breast self-exam: Look for changes  Take off all the clothes above your waist. Stand in front of a mirror in a room with good lighting. Put your hands down at your sides. Compare your breasts in the mirror. Look for any difference between them, such as: A difference in shape. A difference in size. Wrinkles, dips, and bumps in one breast and not the other. Look at each breast for changes in the skin, such as: Redness. Scaly areas. Skin that has gotten thicker. Dimpling. Open sores (ulcers). Look for changes in your nipples, such as: Fluid coming out of a nipple. Fluid around a nipple. Bleeding. Dimpling. Redness. A nipple that looks pushed in (retracted), or that has changed position. Feel for changes Lie on your back. Feel each breast. To do this: Pick a breast to feel. Place a pillow under the shoulder closest to that breast. Put the arm closest to that breast behind your head. Feel the nipple area of that breast using the hand of your other arm. Feel the area with the pads of your three middle fingers by making small circles with your fingers. Use light, medium, and firm pressure. Continue the overlapping circles, moving downward over the  breast. Keep making circles with your fingers. Stop when you feel your ribs. Start making circles with your fingers again, this time going upward until you reach your collarbone. Then, make circles outward across your breast and into your armpit area. Squeeze your nipple. Check for discharge and lumps. Repeat these steps to check your other breast. Sit or stand in the tub or shower. With soapy water on your skin, feel each breast the same way you did when you were lying down. Write down what you find Writing down what you find can help you remember what to tell your doctor. Write down: What is normal for each breast. Any changes you find in each breast. These include: The kind of changes you find. A tender or painful breast. Any lump you find. Write down its size and where it is. When you last had your monthly period (menstrual cycle). General tips If you are breastfeeding, the best time to check your breasts is after you feed your baby or after you use a breast pump. If you get monthly bleeding, the best time to check your breasts is 5-7 days after your monthly cycle ends. With time, you will become comfortable with the self-exam. You will also start to know if there are changes in your breasts. Contact a doctor if: You see a change in the shape or size of your breasts or nipples. You see a change in the skin of your breast or nipples, such as red or scaly skin. You  have fluid coming from your nipples that is not normal. You find a new lump or thick area. You have breast pain. You have any concerns about your breast health. Summary Breast self-awareness includes looking for changes in your breasts and feeling for changes within your breasts. You should do breast self-awareness in front of a mirror in a well-lit room. If you get monthly periods (menstrual cycles), the best time to check your breasts is 5-7 days after your period ends. Tell your doctor about any changes you see in your  breasts. Changes include changes in size, changes on the skin, painful or tender breasts, or fluid from your nipples that is not normal. This information is not intended to replace advice given to you by your health care provider. Make sure you discuss any questions you have with your health care provider. Document Revised: 05/06/2022 Document Reviewed: 10/01/2021 Elsevier Patient Education  2024 [employer].

## 2023-09-19 NOTE — Progress Notes (Signed)
Outpatient Surgical Follow Up  09/19/2023  Dorothy Murray is an 72 y.o. female.   Chief Complaint  Patient presents with   Follow-up    HPI: Dorothy Murray is a 72 y.o. 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 Evette Cristal on May 2017 no dysplasia.  She is following up for likely benign asymmetry with a 6 month interval,  most recent mammo personally reviewed showing benign asymmetry on mammo on the Left w/o changes,  No  suspicious masses.  She also denies any new lumps bumps or nipple discharge.  No weight loss no fevers no chills.  She does self breast exams. Very active No new changes on her health  Past Medical History:  Diagnosis Date   Arthritis    lower back   Crohn's disease (HCC)    Diabetes mellitus without complication (HCC)    NIDDM   Diffuse cystic mastopathy    Dysrhythmia    Hypertension     Past Surgical History:  Procedure Laterality Date   BACK SURGERY  2019   BREAST BIOPSY Bilateral 04/19/2016   stereo w/ Dr. Lemar Livings, benign   BREAST EXCISIONAL BIOPSY Right 04/19/2016   w/Dr. Lemar Livings, benign    BREAST LUMPECTOMY Right 05/03/2016   ATYPICAL DUCTAL HYPERPLASIA (ADH) WITH MICROCALCIFICATIONS/: BREAST LUMPECTOMY; Melbourne Abts, MD;  Location: [facility] ORS;  Service: General;  Laterality: Right;   BREAST LUMPECTOMY WITH NEEDLE LOCALIZATION Right 05/03/2016   Procedure: BREAST LUMPECTOMY WITH NEEDLE LOCALIZATION;  Surgeon: Kieth Brightly, MD;  Location: [facility] ORS;  Service: General;  Laterality: Right;   CHOLECYSTECTOMY     COLONOSCOPY  2010, 2015   [facility]   COLONOSCOPY  12/2013   COLONOSCOPY WITH PROPOFOL N/A 07/17/2018   Procedure: COLONOSCOPY WITH PROPOFOL;  Surgeon: Scot Jun, MD;  Location: [facility] ENDOSCOPY;  Service: Endoscopy;  Laterality: N/A;   HAMMER TOE SURGERY Right 06/07/2019   Procedure: HAMMER TOE CORRECTION RIGHT 2ND;  Surgeon: Recardo Evangelist, DPM;  Location: [facility]ERY [facility];  Service: Podiatry;   Laterality: Right;  Diabetes - insulin and oral meds   OSTECTOMY Right 06/07/2019   Procedure: DOUBLE OSTEOTOMY RIGHT;  Surgeon: Recardo Evangelist, DPM;  Location: [facility] SURGERY [facility];  Service: Podiatry;  Laterality: Right;   WEIL OSTEOTOMY Right 06/07/2019   Procedure: WEIL OSTEOTOMY RIGHT 2ND;  Surgeon: Recardo Evangelist, DPM;  Location: [facility] SURGERY CNTR;  Service: Podiatry;  Laterality: Right;    Family History  Problem Relation Age of Onset   Heart attack Mother    Heart attack Father    Breast cancer Sister    Stomach cancer Sister    Stomach cancer Brother    Breast cancer Daughter 62       Evern Core   Colon cancer Son 2    Social History:  reports that she has never smoked. She has never been exposed to tobacco smoke. She has never used smokeless tobacco. She reports that she does not drink alcohol and does not use drugs.  Allergies:  Allergies  Allergen Reactions   Ciprofloxacin Hives    Medications reviewed.    ROS Full ROS performed and is otherwise negative other than what is stated in HPI   BP 138/77   Pulse 76   Temp 98 F (36.7 C)   Ht 5\' 5"  (1.651 m)   Wt 162 lb (73.5 kg)   SpO2 98%   BMI 26.96 kg/m   Physical Exam    Assessment/Plan:  Physical Exam Chaperone present CONSTITUTIONAL: NAD EYES: Pupils are equal, round, and reactive to light, Sclera are non-icteric. LYMPH NODES:  Lymph nodes in the neck are normal. RESPIRATORY:  Lungs are clear. There is normal respiratory effort, with equal breath sounds bilaterally, and without pathologic use of accessory muscles. CARDIOVASCULAR: Heart is regular without murmurs, gallops, or rubs. BREAST: previous Right lumpectomy scar, no new masses, no LAD, no DC or nipple changes. GI: The abdomen is  soft, nontender, and nondistended. There are no palpable masses. There is no hepatosplenomegaly. Prior Right subcostal incisions w/o hernias GU: Rectal deferred.   MUSCULOSKELETAL: Normal muscle strength  and tone. No cyanosis or edema.   SKIN: Turgor is good and there are no pathologic skin lesions or ulcers. NEUROLOGIC: Motor and sensation is grossly normal. Cranial nerves are grossly intact. PSYCH:  Oriented to person, place and time. Affect is normal   Assessment/Plan: 72 year old Hx of ADH following up with benign asymmetry on mammo on the Left with stability of stability indicating a benign process. I will recommend yearly mammo and PE.  No need for biopsies or further test at this time Please note that I have spent 30 minutes in this encounter including personally reviewing imaging studies, coordinating care, placing orders, performing appropriate documentation.    Sterling Big, MD [facility] General Surgeon

## 2023-11-03 DIAGNOSIS — K5080 Crohn's disease of both small and large intestine without complications: Secondary | ICD-10-CM | POA: Diagnosis not present

## 2023-11-03 DIAGNOSIS — I10 Essential (primary) hypertension: Secondary | ICD-10-CM | POA: Diagnosis not present

## 2023-11-03 DIAGNOSIS — E1165 Type 2 diabetes mellitus with hyperglycemia: Secondary | ICD-10-CM | POA: Diagnosis not present

## 2023-11-28 DIAGNOSIS — M85851 Other specified disorders of bone density and structure, right thigh: Secondary | ICD-10-CM | POA: Diagnosis not present

## 2023-11-28 DIAGNOSIS — M85852 Other specified disorders of bone density and structure, left thigh: Secondary | ICD-10-CM | POA: Diagnosis not present

## 2023-11-28 DIAGNOSIS — E1165 Type 2 diabetes mellitus with hyperglycemia: Secondary | ICD-10-CM | POA: Diagnosis not present

## 2023-11-28 DIAGNOSIS — Z794 Long term (current) use of insulin: Secondary | ICD-10-CM | POA: Diagnosis not present

## 2023-11-28 DIAGNOSIS — I10 Essential (primary) hypertension: Secondary | ICD-10-CM | POA: Diagnosis not present

## 2023-11-29 DIAGNOSIS — E1165 Type 2 diabetes mellitus with hyperglycemia: Secondary | ICD-10-CM | POA: Diagnosis not present

## 2023-11-29 DIAGNOSIS — Z794 Long term (current) use of insulin: Secondary | ICD-10-CM | POA: Diagnosis not present

## 2023-12-15 DIAGNOSIS — E1165 Type 2 diabetes mellitus with hyperglycemia: Secondary | ICD-10-CM | POA: Diagnosis not present

## 2023-12-15 DIAGNOSIS — I10 Essential (primary) hypertension: Secondary | ICD-10-CM | POA: Diagnosis not present

## 2023-12-15 DIAGNOSIS — Z794 Long term (current) use of insulin: Secondary | ICD-10-CM | POA: Diagnosis not present

## 2024-01-19 DIAGNOSIS — K5080 Crohn's disease of both small and large intestine without complications: Secondary | ICD-10-CM | POA: Diagnosis not present

## 2024-01-19 DIAGNOSIS — E162 Hypoglycemia, unspecified: Secondary | ICD-10-CM | POA: Diagnosis not present

## 2024-01-19 DIAGNOSIS — E1165 Type 2 diabetes mellitus with hyperglycemia: Secondary | ICD-10-CM | POA: Diagnosis not present

## 2024-01-19 DIAGNOSIS — I10 Essential (primary) hypertension: Secondary | ICD-10-CM | POA: Diagnosis not present

## 2024-01-19 DIAGNOSIS — Z794 Long term (current) use of insulin: Secondary | ICD-10-CM | POA: Diagnosis not present

## 2024-01-20 DIAGNOSIS — R8290 Unspecified abnormal findings in urine: Secondary | ICD-10-CM | POA: Diagnosis not present

## 2024-01-26 DIAGNOSIS — I10 Essential (primary) hypertension: Secondary | ICD-10-CM | POA: Diagnosis not present

## 2024-01-26 DIAGNOSIS — Z1331 Encounter for screening for depression: Secondary | ICD-10-CM | POA: Diagnosis not present

## 2024-01-26 DIAGNOSIS — E1165 Type 2 diabetes mellitus with hyperglycemia: Secondary | ICD-10-CM | POA: Diagnosis not present

## 2024-01-26 DIAGNOSIS — Z0000 Encounter for general adult medical examination without abnormal findings: Secondary | ICD-10-CM | POA: Diagnosis not present

## 2024-01-26 DIAGNOSIS — K5090 Crohn's disease, unspecified, without complications: Secondary | ICD-10-CM | POA: Diagnosis not present

## 2024-01-26 DIAGNOSIS — Z794 Long term (current) use of insulin: Secondary | ICD-10-CM | POA: Diagnosis not present

## 2024-02-07 DIAGNOSIS — H524 Presbyopia: Secondary | ICD-10-CM | POA: Diagnosis not present

## 2024-02-07 DIAGNOSIS — H353131 Nonexudative age-related macular degeneration, bilateral, early dry stage: Secondary | ICD-10-CM | POA: Diagnosis not present

## 2024-02-07 DIAGNOSIS — H35033 Hypertensive retinopathy, bilateral: Secondary | ICD-10-CM | POA: Diagnosis not present

## 2024-02-07 DIAGNOSIS — E119 Type 2 diabetes mellitus without complications: Secondary | ICD-10-CM | POA: Diagnosis not present

## 2024-02-09 DIAGNOSIS — H524 Presbyopia: Secondary | ICD-10-CM | POA: Diagnosis not present

## 2024-02-10 DIAGNOSIS — Z794 Long term (current) use of insulin: Secondary | ICD-10-CM | POA: Diagnosis not present

## 2024-02-10 DIAGNOSIS — I10 Essential (primary) hypertension: Secondary | ICD-10-CM | POA: Diagnosis not present

## 2024-02-10 DIAGNOSIS — E1165 Type 2 diabetes mellitus with hyperglycemia: Secondary | ICD-10-CM | POA: Diagnosis not present

## 2024-02-28 DIAGNOSIS — E1165 Type 2 diabetes mellitus with hyperglycemia: Secondary | ICD-10-CM | POA: Diagnosis not present

## 2024-02-28 DIAGNOSIS — Z794 Long term (current) use of insulin: Secondary | ICD-10-CM | POA: Diagnosis not present

## 2024-04-26 DIAGNOSIS — Z794 Long term (current) use of insulin: Secondary | ICD-10-CM | POA: Diagnosis not present

## 2024-04-26 DIAGNOSIS — I10 Essential (primary) hypertension: Secondary | ICD-10-CM | POA: Diagnosis not present

## 2024-04-26 DIAGNOSIS — E1165 Type 2 diabetes mellitus with hyperglycemia: Secondary | ICD-10-CM | POA: Diagnosis not present

## 2024-05-21 ENCOUNTER — Telehealth: Payer: Self-pay

## 2024-05-21 DIAGNOSIS — N905 Atrophy of vulva: Secondary | ICD-10-CM

## 2024-05-21 DIAGNOSIS — N952 Postmenopausal atrophic vaginitis: Secondary | ICD-10-CM

## 2024-05-21 MED ORDER — ESTRADIOL 0.1 MG/GM VA CREA
TOPICAL_CREAM | VAGINAL | 12 refills | Status: AC
Start: 2024-05-21 — End: ?

## 2024-05-21 NOTE — Addendum Note (Signed)
 Addended by: Lendon Queen on: 05/21/2024 10:01 AM   Modules accepted: Orders

## 2024-05-21 NOTE — Telephone Encounter (Signed)
 Patient called she was prescribed some premarin  cream for vaginal atrophy on 08/23/2023 by Dr. Denman Fischer. She states that cream is not covered by her insurance, it is to expensive. She needs an alternative treatment. Please advise.

## 2024-05-28 DIAGNOSIS — E1165 Type 2 diabetes mellitus with hyperglycemia: Secondary | ICD-10-CM | POA: Diagnosis not present

## 2024-05-28 DIAGNOSIS — Z794 Long term (current) use of insulin: Secondary | ICD-10-CM | POA: Diagnosis not present

## 2024-07-19 DIAGNOSIS — E1165 Type 2 diabetes mellitus with hyperglycemia: Secondary | ICD-10-CM | POA: Diagnosis not present

## 2024-07-19 DIAGNOSIS — K5080 Crohn's disease of both small and large intestine without complications: Secondary | ICD-10-CM | POA: Diagnosis not present

## 2024-07-19 DIAGNOSIS — Z794 Long term (current) use of insulin: Secondary | ICD-10-CM | POA: Diagnosis not present

## 2024-07-19 DIAGNOSIS — R8290 Unspecified abnormal findings in urine: Secondary | ICD-10-CM | POA: Diagnosis not present

## 2024-07-19 DIAGNOSIS — I10 Essential (primary) hypertension: Secondary | ICD-10-CM | POA: Diagnosis not present

## 2024-07-19 DIAGNOSIS — N6091 Unspecified benign mammary dysplasia of right breast: Secondary | ICD-10-CM | POA: Diagnosis not present

## 2024-07-26 DIAGNOSIS — N390 Urinary tract infection, site not specified: Secondary | ICD-10-CM | POA: Diagnosis not present

## 2024-07-26 DIAGNOSIS — I10 Essential (primary) hypertension: Secondary | ICD-10-CM | POA: Diagnosis not present

## 2024-07-26 DIAGNOSIS — Z1211 Encounter for screening for malignant neoplasm of colon: Secondary | ICD-10-CM | POA: Diagnosis not present

## 2024-07-26 DIAGNOSIS — K5090 Crohn's disease, unspecified, without complications: Secondary | ICD-10-CM | POA: Diagnosis not present

## 2024-07-26 DIAGNOSIS — Z794 Long term (current) use of insulin: Secondary | ICD-10-CM | POA: Diagnosis not present

## 2024-07-26 DIAGNOSIS — E119 Type 2 diabetes mellitus without complications: Secondary | ICD-10-CM | POA: Diagnosis not present

## 2024-08-06 ENCOUNTER — Other Ambulatory Visit: Payer: Self-pay

## 2024-08-06 DIAGNOSIS — R921 Mammographic calcification found on diagnostic imaging of breast: Secondary | ICD-10-CM

## 2024-08-06 DIAGNOSIS — N6091 Unspecified benign mammary dysplasia of right breast: Secondary | ICD-10-CM

## 2024-08-26 DIAGNOSIS — E1165 Type 2 diabetes mellitus with hyperglycemia: Secondary | ICD-10-CM | POA: Diagnosis not present

## 2024-08-26 DIAGNOSIS — Z794 Long term (current) use of insulin: Secondary | ICD-10-CM | POA: Diagnosis not present

## 2024-09-20 ENCOUNTER — Encounter

## 2024-09-20 ENCOUNTER — Ambulatory Visit
Admission: RE | Admit: 2024-09-20 | Discharge: 2024-09-20 | Disposition: A | Discharge status: Home / Self Care | Source: Ambulatory Visit | Attending: Surgery | Admitting: Surgery

## 2024-09-20 DIAGNOSIS — R921 Mammographic calcification found on diagnostic imaging of breast: Secondary | ICD-10-CM | POA: Diagnosis not present

## 2024-09-20 DIAGNOSIS — N6091 Unspecified benign mammary dysplasia of right breast: Secondary | ICD-10-CM | POA: Diagnosis not present

## 2024-09-20 DIAGNOSIS — R928 Other abnormal and inconclusive findings on diagnostic imaging of breast: Secondary | ICD-10-CM | POA: Diagnosis not present

## 2024-09-20 DIAGNOSIS — R92323 Mammographic fibroglandular density, bilateral breasts: Secondary | ICD-10-CM | POA: Diagnosis not present

## 2024-10-01 ENCOUNTER — Encounter: Payer: Self-pay | Admitting: Surgery

## 2024-10-01 ENCOUNTER — Ambulatory Visit (INDEPENDENT_AMBULATORY_CARE_PROVIDER_SITE_OTHER): Admitting: Surgery

## 2024-10-01 VITALS — BP 129/78 | HR 77 | Temp 98.0°F | Ht 65.0 in | Wt 157.0 lb

## 2024-10-01 DIAGNOSIS — N6091 Unspecified benign mammary dysplasia of right breast: Secondary | ICD-10-CM

## 2024-10-01 NOTE — Patient Instructions (Signed)
 We will see you back in one year for a follow up mammogram and to see Dr Jordis, our schedule is not yet available so we placed you in our recalls and will send you out a letter with your appointment information    How to Do a Breast Self-Exam Doing breast self-exams can help you stay healthy. They're one way to know what's normal for your breasts. They can help you catch a problem while it's still small and can be treated. You need to: Check your breasts often. Tell your doctor about any changes. You should do breast self-exams even if you have breast implants. What you need: A mirror. A well-lit room. A pillow or other soft object. How to do a breast self-exam Look for changes  Take off all the clothes above your waist. Stand in front of a mirror in a room with good lighting. Put your hands down at your sides. Compare your breasts in the mirror. Look for difference between them, such as: Differences in shape. Differences in size. Wrinkles, dips, and bumps in one breast and not the other. Look at each breast for skin changes, such as: Redness. Scaly spots. Spots where your skin is thicker. Dimpling. Open sores. Look for changes in your nipples, such as: Fluid coming out of a nipple. Fluid around a nipple. Bleeding. Dimpling. Redness. A nipple that looks pushed in or that has changed position. Feel for changes Lie on your back. Feel each breast. To do this: Pick a breast to feel. Place a pillow under the shoulder closest to that breast. Put the arm closest to that breast behind your head. Feel the breast using the hand of your other arm. Use the pads of your three middle fingers to make small circles starting near the nipple. Use light, medium, and firm pressure. Keep making circles, moving down over the breast. Stop when you feel your ribs. Start making circles with your fingers again, this time going up until you reach your collarbone. Then, make circles out across your  breast and into your armpit area. Squeeze your nipple. Check for fluid and lumps. Do these steps again to check your other breast. Sit or stand in the tub or shower. With soapy water on your skin, feel each breast the same way you did when you were lying down. Write down what you find Writing down what you find can help you keep track of what you want to tell your doctor. Write down: What's normal for each breast. Any changes you find. Write down: The kind of change. If your breast feels tender or painful. Any lump you find. Write down its size and where it is. When you last had your period. General tips If you're breastfeeding, the best time to check your breasts is after you feed your baby or after you use a breast pump. If you get a period, the best time to check your breasts is 5-7 days after your period ends. With time, you'll get more used to doing the self-exam. You'll also start to know if there are changes in your breasts. Contact a doctor if: You see a change in the shape or size of your breasts or nipples. You see a change in the skin of your breast or nipples. You have fluid coming from your nipples that isn't normal. You find a new lump or thick area. You have breast pain. You have any concerns about your breast health. This information is not intended to replace advice given to  you by your health care provider. Make sure you discuss any questions you have with your health care provider. Document Revised: 02/08/2024 Document Reviewed: 02/08/2024 Elsevier Patient Education  2025 [employer].

## 2024-10-01 NOTE — Progress Notes (Signed)
 Outpatient Surgical Follow Up  10/01/2024  Dorothy Murray is an 73 y.o. female.   Chief Complaint  Patient presents with   Follow-up    HPI: Dorothy Murray is a 73 y.o. 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 Dellie on May 2017 no dysplasia.  She is following up for likely benign asymmetry with a 12 month interval on the left ,  most recent mammo personally reviewed showing benign asymmetry on mammo on the Left w/o changes,  No  suspicious masses.  She also denies any new lumps bumps or nipple discharge.  No weight loss no fevers no chills.  She does self breast exams. Very active. She has lost 5 lbs since last visit. No new changes on her health    Past Medical History:  Diagnosis Date   Arthritis    lower back   Crohn's disease (HCC)    Diabetes mellitus without complication (HCC)    NIDDM   Diffuse cystic mastopathy    Dysrhythmia    Hypertension     Past Surgical History:  Procedure Laterality Date   BACK SURGERY  2019   BREAST BIOPSY Bilateral 04/19/2016   stereo w/ Dr. Dessa, benign   BREAST EXCISIONAL BIOPSY Right 04/19/2016   w/Dr. Dessa, benign    BREAST LUMPECTOMY Right 05/03/2016   ATYPICAL DUCTAL HYPERPLASIA (ADH) WITH MICROCALCIFICATIONS/: BREAST LUMPECTOMY; ACHILLE Dellie, MD;  Location: [facility] ORS;  Service: General;  Laterality: Right;   BREAST LUMPECTOMY WITH NEEDLE LOCALIZATION Right 05/03/2016   Procedure: BREAST LUMPECTOMY WITH NEEDLE LOCALIZATION;  Surgeon: Louanne KANDICE Dellie, MD;  Location: [facility] ORS;  Service: General;  Laterality: Right;   CHOLECYSTECTOMY     COLONOSCOPY  2010, 2015   [facility]   COLONOSCOPY  12/2013   COLONOSCOPY WITH PROPOFOL  N/A 07/17/2018   Procedure: COLONOSCOPY WITH PROPOFOL ;  Surgeon: Viktoria Lamar DASEN, MD;  Location: [facility] ENDOSCOPY;  Service: Endoscopy;  Laterality: N/A;   HAMMER TOE SURGERY Right 06/07/2019   Procedure: HAMMER TOE CORRECTION RIGHT 2ND;  Surgeon: Lilli Cough, DPM;   Location: [facility];  Service: Podiatry;  Laterality: Right;  Diabetes - insulin and oral meds   OSTECTOMY Right 06/07/2019   Procedure: DOUBLE OSTEOTOMY RIGHT;  Surgeon: Lilli Cough, DPM;  Location: [facility];  Service: Podiatry;  Laterality: Right;   WEIL OSTEOTOMY Right 06/07/2019   Procedure: WEIL OSTEOTOMY RIGHT 2ND;  Surgeon: Lilli Cough, DPM;  Location: [facility] SUR[facility]ERY [facility];  Service: Podiatry;  Laterality: Right;    Family History  Problem Relation Age of Onset   Heart attack Mother    Heart attack Father    Breast cancer Sister    Stomach cancer Sister    Stomach cancer Brother    Breast cancer Daughter 38       Charlies Molt   Colon cancer Son 27    Social History:  reports that she has never smoked. She has never been exposed to tobacco smoke. She has never used smokeless tobacco. She reports that she does not drink alcohol  and does not use drugs.  Allergies:  Allergies  Allergen Reactions   Ciprofloxacin Hives    Medications reviewed.    ROS Full ROS performed and is otherwise negative other than what is stated in HPI   BP 129/78   Pulse 77   Temp 98 F (36.7 C) (Oral)   Ht 5' 5 (1.651 m)   Wt 157 lb (71.2 kg)   SpO2  99%   BMI 26.13 kg/m   Physical Exam   Physical Exam Chaperone present CONSTITUTIONAL: NAD EYES: Pupils are equal, round, and reactive to light, Sclera are non-icteric. LYMPH NODES:  Lymph nodes in the neck are normal. RESPIRATORY:  Lungs are clear. There is normal respiratory effort, with equal breath sounds bilaterally, and without pathologic use of accessory muscles. CARDIOVASCULAR: Heart is regular without murmurs, gallops, or rubs. BREAST: previous Right lumpectomy scar, no new masses, no LAD, no DC or nipple changes. GI: The abdomen is  soft, nontender, and nondistended. There are no palpable masses. There is no hepatosplenomegaly.  GU: Rectal deferred.   MUSCULOSKELETAL: Normal muscle strength and  tone. No cyanosis or edema.   SKIN: Turgor is good and there are no pathologic skin lesions or ulcers. NEUROLOGIC: Motor and sensation is grossly normal. Cranial nerves are grossly intact. PSYCH:  Oriented to person, place and time. Affect is normal   Assessment/Plan: 73 year old Hx of ADH on the Right side following up with benign asymmetry on mammo on the Left with stability of stability indicating a benign process. I will recommend yearly mammo and PE.  No need for biopsies or further test at this time I personally spent 30 minutes in the care of the patient today including performing a medically appropriate exam/evaluation, counseling and educating, placing orders, reviwing medical records, referring and communicating with other health care professionals, documenting clinical information in the EHR, independently interpreting and reviewing images studies and coordinating care.   Laneta Luna, MD [facility] General Surgeon
